# Patient Record
Sex: Male | Born: 1955 | Race: White | Hispanic: No | Marital: Married | Smoking: Never smoker
Health system: Southern US, Community
[De-identification: ages and names within clinical notes are randomized; demographics above are authoritative.]

## PROBLEM LIST (undated history)

## (undated) DIAGNOSIS — R202 Paresthesia of skin: Secondary | ICD-10-CM

## (undated) DIAGNOSIS — J309 Allergic rhinitis, unspecified: Secondary | ICD-10-CM

## (undated) DIAGNOSIS — S46311A Strain of muscle, fascia and tendon of triceps, right arm, initial encounter: Secondary | ICD-10-CM

## (undated) DIAGNOSIS — T7840XA Allergy, unspecified, initial encounter: Secondary | ICD-10-CM

## (undated) DIAGNOSIS — S060XAA Concussion with loss of consciousness status unknown, initial encounter: Secondary | ICD-10-CM

## (undated) DIAGNOSIS — D869 Sarcoidosis, unspecified: Secondary | ICD-10-CM

## (undated) DIAGNOSIS — K8020 Calculus of gallbladder without cholecystitis without obstruction: Secondary | ICD-10-CM

## (undated) DIAGNOSIS — J45909 Unspecified asthma, uncomplicated: Secondary | ICD-10-CM

## (undated) DIAGNOSIS — M21371 Foot drop, right foot: Secondary | ICD-10-CM

## (undated) DIAGNOSIS — H9319 Tinnitus, unspecified ear: Secondary | ICD-10-CM

## (undated) DIAGNOSIS — R200 Anesthesia of skin: Secondary | ICD-10-CM

## (undated) DIAGNOSIS — M1990 Unspecified osteoarthritis, unspecified site: Secondary | ICD-10-CM

## (undated) DIAGNOSIS — J189 Pneumonia, unspecified organism: Secondary | ICD-10-CM

## (undated) DIAGNOSIS — I2699 Other pulmonary embolism without acute cor pulmonale: Secondary | ICD-10-CM

## (undated) DIAGNOSIS — S060X9A Concussion with loss of consciousness of unspecified duration, initial encounter: Secondary | ICD-10-CM

## (undated) DIAGNOSIS — B958 Unspecified staphylococcus as the cause of diseases classified elsewhere: Secondary | ICD-10-CM

## (undated) DIAGNOSIS — R911 Solitary pulmonary nodule: Secondary | ICD-10-CM

## (undated) DIAGNOSIS — K219 Gastro-esophageal reflux disease without esophagitis: Secondary | ICD-10-CM

## (undated) DIAGNOSIS — R42 Dizziness and giddiness: Secondary | ICD-10-CM

## (undated) HISTORY — DX: Other pulmonary embolism without acute cor pulmonale: I26.99

## (undated) HISTORY — DX: Sarcoidosis, unspecified: D86.9

## (undated) HISTORY — PX: KNEE SURGERY: SHX244

## (undated) HISTORY — PX: ANKLE SURGERY: SHX546

## (undated) HISTORY — DX: Unspecified asthma, uncomplicated: J45.909

## (undated) HISTORY — PX: PICC LINE PLACE PERIPHERAL (ARMC HX): HXRAD1248

## (undated) HISTORY — PX: OTHER SURGICAL HISTORY: SHX169

## (undated) HISTORY — PX: EYE SURGERY: SHX253

## (undated) HISTORY — DX: Allergy, unspecified, initial encounter: T78.40XA

## (undated) HISTORY — PX: BACK SURGERY: SHX140

## (undated) HISTORY — DX: Allergic rhinitis, unspecified: J30.9

## (undated) HISTORY — DX: Solitary pulmonary nodule: R91.1

---

## 1999-03-02 ENCOUNTER — Ambulatory Visit (HOSPITAL_COMMUNITY): Admission: RE | Admit: 1999-03-02 | Discharge: 1999-03-02 | Payer: Self-pay | Admitting: Orthopedic Surgery

## 2004-08-07 ENCOUNTER — Observation Stay (HOSPITAL_COMMUNITY): Admission: EM | Admit: 2004-08-07 | Discharge: 2004-08-08 | Payer: Self-pay | Admitting: *Deleted

## 2005-07-27 ENCOUNTER — Encounter: Admission: RE | Admit: 2005-07-27 | Discharge: 2005-07-27 | Payer: Self-pay | Admitting: Neurosurgery

## 2005-08-09 ENCOUNTER — Ambulatory Visit (HOSPITAL_COMMUNITY): Admission: RE | Admit: 2005-08-09 | Discharge: 2005-08-10 | Payer: Self-pay | Admitting: Neurosurgery

## 2005-08-12 ENCOUNTER — Ambulatory Visit (HOSPITAL_COMMUNITY): Admission: RE | Admit: 2005-08-12 | Discharge: 2005-08-12 | Payer: Self-pay | Admitting: Neurosurgery

## 2005-08-18 ENCOUNTER — Ambulatory Visit (HOSPITAL_COMMUNITY): Admission: RE | Admit: 2005-08-18 | Discharge: 2005-08-19 | Payer: Self-pay | Admitting: Neurosurgery

## 2007-01-16 ENCOUNTER — Encounter: Admission: RE | Admit: 2007-01-16 | Discharge: 2007-01-16 | Payer: Self-pay | Admitting: Neurosurgery

## 2009-10-01 ENCOUNTER — Ambulatory Visit (HOSPITAL_COMMUNITY): Admission: RE | Admit: 2009-10-01 | Discharge: 2009-10-01 | Payer: Self-pay | Admitting: Neurosurgery

## 2010-12-11 ENCOUNTER — Emergency Department (HOSPITAL_COMMUNITY)
Admission: EM | Admit: 2010-12-11 | Discharge: 2010-12-11 | Payer: Self-pay | Source: Home / Self Care | Admitting: Emergency Medicine

## 2011-02-05 ENCOUNTER — Emergency Department (HOSPITAL_BASED_OUTPATIENT_CLINIC_OR_DEPARTMENT_OTHER)
Admission: EM | Admit: 2011-02-05 | Discharge: 2011-02-05 | Disposition: A | Discharge status: Home / Self Care | Payer: 59 | Attending: Emergency Medicine | Admitting: Emergency Medicine

## 2011-02-05 ENCOUNTER — Emergency Department (INDEPENDENT_AMBULATORY_CARE_PROVIDER_SITE_OTHER): Payer: 59

## 2011-02-05 DIAGNOSIS — W19XXXA Unspecified fall, initial encounter: Secondary | ICD-10-CM

## 2011-02-05 DIAGNOSIS — M25559 Pain in unspecified hip: Secondary | ICD-10-CM | POA: Insufficient documentation

## 2011-02-05 DIAGNOSIS — M79609 Pain in unspecified limb: Secondary | ICD-10-CM | POA: Insufficient documentation

## 2011-02-05 DIAGNOSIS — R109 Unspecified abdominal pain: Secondary | ICD-10-CM | POA: Insufficient documentation

## 2011-02-05 LAB — URINALYSIS, ROUTINE W REFLEX MICROSCOPIC
Bilirubin Urine: NEGATIVE
Hgb urine dipstick: NEGATIVE
Nitrite: NEGATIVE
Urine Glucose, Fasting: NEGATIVE mg/dL

## 2011-02-05 MED ORDER — IOHEXOL 300 MG/ML  SOLN
100.0000 mL | Freq: Once | INTRAMUSCULAR | Status: AC | PRN
  Administered 2011-02-05: 100 mL via INTRAVENOUS

## 2011-05-06 NOTE — Op Note (Signed)
NAME:  Jeffrey Adkins, Jeffrey Adkins                         ACCOUNT NO.:  [id number]   MEDICAL RECORD NO.:  [id number]                   PATIENT TYPE:  INP   LOCATION:  0102                                 FACILITY:  [facility]   PHYSICIAN:  Vania Rea. Supple, M.D.               DATE OF BIRTH:  [birth year]   DATE OF PROCEDURE:  08/07/2004  DATE OF DISCHARGE:                                 OPERATIVE REPORT   PREOPERATIVE DIAGNOSIS:  Displaced right ankle medial malleolus fracture.   POSTOPERATIVE DIAGNOSIS:  Displaced right ankle medial malleolus fracture.   PROCEDURE:  Open reduction and internal fixation of displaced right medial  malleolus fracture.   SURGEON OF RECORD:  Francena Hanly, M.D.   ASSISTANT:  French Ana Shuford, P.A.-C.   ANESTHESIA:  General endotracheal.   TOURNIQUET TIME:  39 minutes.   ESTIMATED BLOOD LOSS:  Minimal.   DRAINS:  None.   HISTORY:  [name] is a 55 year old gentleman, who was rock climbing [place] earlier today when he apparently lost his balance and fell close to  what he describes as 60 feet.  He came down landing on the right ankle with  immediate complaints of pain and inability to bear weight.  He had no  complaints of other bodily pains, although did have some superficial  abrasions.  He transported by private vehicle to [facility] where x-rays of  the right ankle were obtained and confirmed the displaced right ankle medial  malleolus fracture.  He is subsequently brought to the operating room at  this time for planned ORIF of the right ankle.   Preoperatively, we have Mr. Koble as well as his wife on treatment options  as well as risks versus benefits thereof.  Possible surgical complications  of bleeding, infection, neurovascular injury, DVT, PE, malunion, nonunion,  posttraumatic arthritis, and possible need for additional surgery are all  reviewed.  He understands and accepts and agrees with planned procedure.   PROCEDURE IN DETAIL:  After  undergoing routine preoperative evaluation, the  patient received prophylactic antibiotics.  Placed supine on the operative  table and underwent smooth induction of general endotracheal anesthesia.  Tourniquet applied to the right thigh, and right leg was then sterilely  prepped and draped in standard fashion.  Leg was exsanguinated with the  tourniquet inflated to 300 mmHg.  An anterior hockey-stick incision was  then made about the medial malleolus approximately 4 cm in length.  Skin  flaps were circumferentially mobilized, and the underlying superficial bands  were electrocoagulated.  Deep fascia was divided to allow an access to the  fracture site with large hematoma evacuated.  Periosteum was then divided  away from the edges of the fracture site and on inspection, the medial  malleolar fracture fragment was found to have been rotated 90 degrees.  It  was reduced and then the joint visualized and copiously irrigated.  No  obvious intraarticular  loose bodies or articular cartilage damaged.  Interpose soft tissue was then meticulously removed.  Direct reduction was  performed under direct visualization and held with a towel clip and then a  two-thread tip guidepins for the 3.5 cannulated screws were then passed.  Fluoroscopic imaging was then utilized and confirmed to anatomic alignment  of the fracture site.  The guidepins were then overdrilled, tapped, and two  44 mm partially-threaded cannulated 3.5 lag screws were passed.  The  anterior one had a washer; the posterior one did not.  Excellent bony  purchase was achieved with both screws, and good compression at the fracture  site was achieved.  Subsequent fluoroscopic images confirmed anatomic  reduction at the fracture site and good position of the hardware.  The wound  was then copiously irrigated.  It was closed in layers with 3-0 Vicryl for  the subcutaneous layer, intracuticular 3-0 Monocryl for the skin.  Steri-  Strips were  applied.  A field block of 0.5% plain Marcaine was then  established.  Bulky dry dressings were applied followed by well-padded short  leg plaster splint with the ankle in neutral position.  The tourniquet was  then let down, and the patient was extubated and taken to the recovery room  in stable condition.                                               Vania Rea. Supple, M.D.    KMS/MEDQ  D:  08/07/2004  T:  08/08/2004  Job:  [id number]

## 2011-05-06 NOTE — Op Note (Signed)
NAME:  Jeffrey Adkins, Jeffrey Adkins NO.:  [id number]   MEDICAL RECORD NO.:  [id number]          PATIENT TYPE:  OIB   LOCATION:  3032                         FACILITY:  [facility]   PHYSICIAN:  Donalee Citrin, M.D.        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   OPERATION/PROCEDURE:  1.  Redo lumbar laminectomy.  2.  Reexploration of previous surgical incision at L4-5.  3.  Redo diskectomy at L4-5 with redo foraminotomy at L5 nerve root under      microscopic dissection.   SURGEON:  Donalee Citrin, M.D.   ASSISTANT:  Kathaleen Maser. Pool, M.D.   ANESTHESIA:  General endotracheal anesthesia.   FINDINGS:  Large recurrent ruptured disk centrally under the L5 nerve root  as well as laterally in the canal compressing the thecal sac and the  proximal L5 nerve root.   HISTORY OF PRESENT ILLNESS:  The patient is a very pleasant 55 year old  gentleman who underwent laminectomy and microdiskectomy a week ago. Had  about two days where he had symptomatic improvement of the leg and the leg  started recurring refractory to steroid treatment.  Repeat MRI scan showed  persistent thecal sac defect and central probable disk herniation at the  operative site as the patient had failed steroid treatment for possible  radiculitis.  The patient was taken back for reexploration.   DESCRIPTION OF PROCEDURE:  The patient was brought to the OR where he  received general anesthesia, placed supine on the Wilson frame.  His old  incision was opened up and extended slightly inferiorly.  At this time the  muscle fascia was taken down bilaterally and the very tip of the spinous  process was removed to gain more medial access and medial exposure.  The  Gelfoam was removed from the operative site and laminotomy defect, and the  lateral facet complex was drilled down and a facetectomy was extended  slightly laterally.  Then a #4 Nicholos Johns was used to get underneath the  thecal sac and immediately what was visualized was a large lateral disk  herniation that was new.  This was eased away with a blunt nerve hook and  pituitary rongeurs.  Then underneath the L5 root, coronary dilator was  palpated and a very large central fragment was removed and at this point,  the L5 nerve root immediately became freed up and more easily mobilized  around the pedicle.  The disk space was entered and additional fragments  were teased away.  Extensive work was carried out laterally to get further  lateral fragments and free up the lateral complex underneath the facet  joint.  The pedicle was again appreciated and the facetectomy was carried  slightly lateral and the foraminotomy was extended slightly distally to get  more flush with the pedicle and get further the L5 nerve root exposed.  This  allowed more free mobilization of the  L5 nerve root, easily palpated with  coronary dilator and angle hockey stick and noted to have no resistance at  all both centrally and distally.  Then after the lateral facetectomy had  been completed, there was no further stenosis of the disk lateral in the  canal.  Then the wound was copiously irrigated and hemostasis was  maintained.  The disk space again was markedly collapsed and degenerated.  Gelfoam was laid on top of the dura.  There was no spinal fluid appreciated.  The Duragen patch previously laid was kept in place. This was not removed  and the L5 lamina was intact. This was not needed to be  extended inferiorly.  Then muscle fascia was reapproximated with 0  interrupted Vicryl.  The subcutaneous tissue was closed with 2-0 interrupted  Vicryl and skin was closed with running 4-0 subcuticular.  The patient went  to the recovery room in stable condition.  At the end of the case,   __________.           ______________________________  Donalee Citrin, M.D.     GC/MEDQ  D:  08/18/2005  T:  08/19/2005  Job:  [id number]

## 2011-05-06 NOTE — Op Note (Signed)
NAME:  Jeffrey Adkins, Jeffrey Adkins NO.:  [id number]   MEDICAL RECORD NO.:  [id number]          PATIENT TYPE:  OIB   LOCATION:  2874                         FACILITY:  [facility]   PHYSICIAN:  Donalee Citrin, M.D.        DATE OF BIRTH:  [birth year]   DATE OF PROCEDURE:  08/09/2005  DATE OF DISCHARGE:                                 OPERATIVE REPORT   PREOPERATIVE DIAGNOSIS:  Left L5 radiculopathy from large ruptured disk at  L4-5 left with caudal migration behind the L5 vertebral body and compression  of the L5 nerve against the pedicle.   POSTOPERATIVE DIAGNOSIS:  Left L5 radiculopathy from large ruptured disk at  L4-5 left with caudal migration behind the L5 vertebral body and compression  of the L5 nerve against the pedicle.   OPERATION PERFORMED:  Lumbar laminectomy and microdiskectomy, L4-5 on the  left.  Taking into account the patient has a transitional segment and this  was the second true disk space above the transitional segment which we will  label T1 with L5-T1 being the bottom disk space.  Microscopic dissection of  the left L5 nerve root and microscopic diskectomy.   SURGEON:  Donalee Citrin, M.D.   ASSISTANT:  Reinaldo Meeker, M.D.   ANESTHESIA:  General endotracheal.   INDICATIONS FOR PROCEDURE:  The patient is a very pleasant 55 year old  gentleman who has had longstanding back and predominantly left leg pain  radiating down to the top of his foot and his big toe with weakness on  dorsiflexion, presented today.  The patient's preoperative imaging showed a  large ruptured disk with caudal migration behind the L5 vertebral body with  compression of the L5 nerve against the L5 pedicle.  The patient was  recommended laminectomy and microdiskectomy.  The risks and benefits were  explained to the patient and he understands and agreed to proceed forward.   DESCRIPTION OF PROCEDURE:  The patient was brought to the operating room  where he was induced under general  anesthesia, positioned prone on the  Wilson frame.  Back was scrubbed, prepped and draped in the usual sterile  fashion.  His preoperative imaging localized the L4-5 disk space at the  superior aspect of his old scar from his previous L5-T1 laminectomy.  After  infiltration of 10 mL of lidocaine with epinephrine, a midline incision was  made and Bovie electrocautery was used to take down the subcutaneous  tissues.  Subperiosteal dissection was carried out lamina of L4 and L5.  Intraoperative x-ray confirmed localization of the L4-5 disk space.  Then  the medial aspect of the facet complex, inferior aspect of the lamina of L4,  medial spinous process of L4 and superior aspect of lamina of L5 was drilled  down with a high speed drill. Then using a 2 and 3 mm Kerrison punch, the  inferior aspect of the lamina of L5 was removed.  The lamina was noted to be  markedly thickened and had to be drilled down more to create a channel for  the Kerrison to fit under.  Then after the  superior aspect of the ligamentum  flavum was identified, this was teased away with a blunt nerve hook,  exposing the thecal sac.  At this point the operating microscope was draped  and brought into the field and under microscopic illumination, the remainder  of the ligament was dissected off of the dura and the lateral aspect of the  canal was identified using a combination of a blunt nerve hook and a 4  Penfield.  The ligamentum flavum was dissected free of the thecal sac  exposing the lateral gutter and the proximal L5 nerve root.  The L5 neural  foramen was unroofed and the superior aspect of the lamina of L5 was removed  and the lateral gutter was underbitten to expose the lateral aspect of the  thecal sac.  Then using a 4 Penfield, the L5 nerve root was dissected off.  A large bulbous disk fragment was still contained within the ligament,  however, and this was used to reflect the L5 nerve root medially.  Then a   D'Errico was used to place in the canal reflecting the L5 nerve root  medially.  Annulotomy was done with an 11 blade scalpel.  Pituitary rongeurs  were used to radically clean out the disk space and several large fragments  were removed from just inferior to the disk space at the level of the  pedicle.  The ligament underneath the L5 nerve was then markedly  hypertrophied and several fragments of disk had to be freed up from the  undersurface of this ligament and this took a little bit of extra time;  however, at the end of the diskectomy there was no further stenosis of the  L5 ligament.  Because of the thickness of the ligament and the ability to  kind of see distally out the neural foramen it was felt to extend the L5  laminectomy slightly inferior to get better access and better view of the L5  nerve and in this process there was a small thickness dural tear appreciated  superior aspect of the lamina. This was not leaking CSF, so it was packed  away with a piece of Gelfoam and cottonoids at the time being.  Then the  remainder of the undersurface of the 5 nerve root was palpated and explored  with a coronary dilator and angled hockey stick and noted to have no further  stenosis distally out the foramen.  The lateral disk space was then cleaned  out with pituitary rongeurs and the medial disk space was appreciated.  Downgoing Epstein curettes were then used to clean out the central  compartment and using a coronary dilator and angled hockey stick, no further  stenosis was appreciated superior, inferior, medial, lateral at the level of  the disk space and the level of the pedicle.  The L5 nerve root was noted to  be widely compressed and unroofed.  At this point the wound was copiously  irrigated and meticulous hemostasis was maintained.  A small piece of 1 x 1  Duragen was overlaid along the top of the split thickness dural tear and Tisseel was overlaid on top of this.  Then Gelfoam and  Tisseel again.  Then  hemostasis was maintained in the muscle and the muscle and subcutaneous  tissues were closed in layers with interrupted Vicryl.  Then a running 4-0  subcuticular Vicryl was placed.  Dermabond, benzoin and Steri-Strips applied  and the patient was sent to recovery in stable condition.  At the end  of the  case sponge, needle and instrument counts were correct.           ______________________________  Donalee Citrin, M.D.     GC/MEDQ  D:  08/09/2005  T:  08/10/2005  Job:  [id number]

## 2013-01-28 ENCOUNTER — Encounter (HOSPITAL_COMMUNITY): Payer: Self-pay | Admitting: Nurse Practitioner

## 2013-01-28 ENCOUNTER — Emergency Department (HOSPITAL_COMMUNITY)
Admission: EM | Admit: 2013-01-28 | Discharge: 2013-01-28 | Discharge status: Against Medical Advice | Payer: 59 | Attending: Emergency Medicine | Admitting: Emergency Medicine

## 2013-01-28 ENCOUNTER — Emergency Department (HOSPITAL_COMMUNITY): Payer: 59

## 2013-01-28 DIAGNOSIS — R071 Chest pain on breathing: Secondary | ICD-10-CM | POA: Insufficient documentation

## 2013-01-28 DIAGNOSIS — R0602 Shortness of breath: Secondary | ICD-10-CM | POA: Insufficient documentation

## 2013-01-28 LAB — POCT I-STAT TROPONIN I: Troponin i, poc: 0.01 ng/mL (ref 0.00–0.08)

## 2013-01-28 LAB — CBC
HCT: 41.3 % (ref 39.0–52.0)
Hemoglobin: 15 g/dL (ref 13.0–17.0)
MCHC: 36.3 g/dL — ABNORMAL HIGH (ref 30.0–36.0)
Platelets: 233 10*3/uL (ref 150–400)
WBC: 7.7 10*3/uL (ref 4.0–10.5)

## 2013-01-28 LAB — BASIC METABOLIC PANEL
CO2: 28 mEq/L (ref 19–32)
Chloride: 103 mEq/L (ref 96–112)
Sodium: 142 mEq/L (ref 135–145)

## 2013-01-28 LAB — PRO B NATRIURETIC PEPTIDE: Pro B Natriuretic peptide (BNP): 62.2 pg/mL (ref 0–125)

## 2013-01-28 NOTE — ED Notes (Signed)
Pt reports he was sitting at work yesterday and felt sudden pain under L rib that since has moved into bilateral lower chest and back. Hurts to breathe and feels mild SOB. Pt reports recent [place] from [place]. A&Ox4, resp e/u. Denies cardiac history

## 2013-01-28 NOTE — ED Notes (Signed)
Pt did not answer.

## 2013-02-21 ENCOUNTER — Emergency Department (HOSPITAL_BASED_OUTPATIENT_CLINIC_OR_DEPARTMENT_OTHER): Payer: 59

## 2013-02-21 ENCOUNTER — Encounter (HOSPITAL_BASED_OUTPATIENT_CLINIC_OR_DEPARTMENT_OTHER): Payer: Self-pay | Admitting: *Deleted

## 2013-02-21 ENCOUNTER — Other Ambulatory Visit: Payer: Self-pay

## 2013-02-21 ENCOUNTER — Inpatient Hospital Stay (HOSPITAL_BASED_OUTPATIENT_CLINIC_OR_DEPARTMENT_OTHER)
Admission: EM | Admit: 2013-02-21 | Discharge: 2013-02-21 | DRG: 176 | Discharge status: Against Medical Advice | Payer: 59 | Attending: Internal Medicine | Admitting: Internal Medicine

## 2013-02-21 DIAGNOSIS — Z91012 Allergy to eggs: Secondary | ICD-10-CM

## 2013-02-21 DIAGNOSIS — R911 Solitary pulmonary nodule: Secondary | ICD-10-CM

## 2013-02-21 DIAGNOSIS — K5900 Constipation, unspecified: Secondary | ICD-10-CM | POA: Diagnosis present

## 2013-02-21 DIAGNOSIS — R599 Enlarged lymph nodes, unspecified: Secondary | ICD-10-CM

## 2013-02-21 DIAGNOSIS — Z91010 Allergy to peanuts: Secondary | ICD-10-CM

## 2013-02-21 DIAGNOSIS — K8020 Calculus of gallbladder without cholecystitis without obstruction: Secondary | ICD-10-CM | POA: Diagnosis present

## 2013-02-21 DIAGNOSIS — Z880 Allergy status to penicillin: Secondary | ICD-10-CM

## 2013-02-21 DIAGNOSIS — I2699 Other pulmonary embolism without acute cor pulmonale: Principal | ICD-10-CM

## 2013-02-21 DIAGNOSIS — Z885 Allergy status to narcotic agent status: Secondary | ICD-10-CM

## 2013-02-21 DIAGNOSIS — Z91018 Allergy to other foods: Secondary | ICD-10-CM

## 2013-02-21 DIAGNOSIS — D869 Sarcoidosis, unspecified: Secondary | ICD-10-CM

## 2013-02-21 DIAGNOSIS — R590 Localized enlarged lymph nodes: Secondary | ICD-10-CM

## 2013-02-21 DIAGNOSIS — J90 Pleural effusion, not elsewhere classified: Secondary | ICD-10-CM

## 2013-02-21 DIAGNOSIS — R071 Chest pain on breathing: Secondary | ICD-10-CM | POA: Diagnosis present

## 2013-02-21 HISTORY — DX: Solitary pulmonary nodule: R91.1

## 2013-02-21 LAB — COMPREHENSIVE METABOLIC PANEL
ALT: 28 U/L (ref 0–53)
ALT: 26 U/L (ref 0–53)
AST: 24 U/L (ref 0–37)
AST: 21 U/L (ref 0–37)
CO2: 26 mEq/L (ref 19–32)
CO2: 27 mEq/L (ref 19–32)
Calcium: 9.8 mg/dL (ref 8.4–10.5)
Calcium: 9.8 mg/dL (ref 8.4–10.5)
Creatinine, Ser: 1.24 mg/dL (ref 0.50–1.35)
GFR calc Af Amer: 75 mL/min — ABNORMAL LOW (ref >90)
GFR calc non Af Amer: 61 mL/min — ABNORMAL LOW (ref >90)
GFR calc non Af Amer: 64 mL/min — ABNORMAL LOW (ref >90)
Sodium: 137 mEq/L (ref 135–145)
Sodium: 140 mEq/L (ref 135–145)
Total Protein: 7.4 g/dL (ref 6.0–8.3)
Total Protein: 7 g/dL (ref 6.0–8.3)

## 2013-02-21 LAB — URINALYSIS, ROUTINE W REFLEX MICROSCOPIC
Leukocytes, UA: NEGATIVE
Nitrite: NEGATIVE
Protein, ur: NEGATIVE mg/dL
Urobilinogen, UA: 1 mg/dL (ref 0.0–1.0)

## 2013-02-21 LAB — CBC WITH DIFFERENTIAL/PLATELET
Basophils Absolute: 0 10*3/uL (ref 0.0–0.1)
Eosinophils Relative: 2 % (ref 0–5)
HCT: 40.4 % (ref 39.0–52.0)
Hemoglobin: 14.7 g/dL (ref 13.0–17.0)
Lymphocytes Relative: 15 % (ref 12–46)
Lymphs Abs: 2 10*3/uL (ref 0.7–4.0)
MCH: 31 pg (ref 26.0–34.0)
MCHC: 36.4 g/dL — ABNORMAL HIGH (ref 30.0–36.0)
MCV: 86 fL (ref 78.0–100.0)
Monocytes Absolute: 1.2 10*3/uL — ABNORMAL HIGH (ref 0.1–1.0)
Monocytes Relative: 13 % — ABNORMAL HIGH (ref 3–12)
Neutro Abs: 5.5 10*3/uL (ref 1.7–7.7)
Neutrophils Relative %: 62 % (ref 43–77)
Platelets: 180 10*3/uL (ref 150–400)
RBC: 4.74 MIL/uL (ref 4.22–5.81)
RDW: 13 % (ref 11.5–15.5)
WBC: 10.7 10*3/uL — ABNORMAL HIGH (ref 4.0–10.5)

## 2013-02-21 LAB — TROPONIN I: Troponin I: <0.3 ng/mL (ref <0.30)

## 2013-02-21 LAB — PROTIME-INR: INR: 1.25 (ref 0.00–1.49)

## 2013-02-21 MED ORDER — RIVAROXABAN 15 MG PO TABS
15.0000 mg | ORAL_TABLET | Freq: Two times a day (BID) | ORAL | Status: DC
  Administered 2013-02-21: 15 mg via ORAL
  Filled 2013-02-21 (×3): qty 1

## 2013-02-21 MED ORDER — HYDROMORPHONE HCL PF 1 MG/ML IJ SOLN: 1.0000 mg | INTRAMUSCULAR | Status: DC | PRN

## 2013-02-21 MED ORDER — HEPARIN (PORCINE) IN NACL 100-0.45 UNIT/ML-% IJ SOLN
12.0000 [IU]/kg/h | Freq: Once | INTRAMUSCULAR | Status: AC
  Administered 2013-02-21: 12 [IU]/kg/h via INTRAVENOUS
  Filled 2013-02-21: qty 250

## 2013-02-21 MED ORDER — SODIUM CHLORIDE 0.9 % IJ SOLN: 3.0000 mL | Freq: Two times a day (BID) | INTRAMUSCULAR | Status: DC

## 2013-02-21 MED ORDER — SODIUM CHLORIDE 0.9 % IV SOLN: INTRAVENOUS | Status: DC

## 2013-02-21 MED ORDER — ONDANSETRON HCL 4 MG PO TABS: 4.0000 mg | ORAL_TABLET | Freq: Four times a day (QID) | ORAL | Status: DC | PRN

## 2013-02-21 MED ORDER — TUBERCULIN PPD 5 UNIT/0.1ML ID SOLN
5.0000 [IU] | Freq: Once | INTRADERMAL | Status: DC
  Administered 2013-02-21: 5 [IU] via INTRADERMAL
  Filled 2013-02-21: qty 0.1

## 2013-02-21 MED ORDER — IOHEXOL 350 MG/ML SOLN
100.0000 mL | Freq: Once | INTRAVENOUS | Status: AC | PRN
  Administered 2013-02-21: 100 mL via INTRAVENOUS

## 2013-02-21 MED ORDER — RIVAROXABAN 15 MG PO TABS: 15.0000 mg | ORAL_TABLET | Freq: Two times a day (BID) | ORAL | Status: DC

## 2013-02-21 MED ORDER — HEPARIN BOLUS VIA INFUSION
4000.0000 [IU] | Freq: Once | INTRAVENOUS | Status: AC
  Administered 2013-02-21: 4000 [IU] via INTRAVENOUS
  Filled 2013-02-21: qty 4000

## 2013-02-21 MED ORDER — ACETAMINOPHEN 650 MG RE SUPP: 650.0000 mg | Freq: Four times a day (QID) | RECTAL | Status: DC | PRN

## 2013-02-21 MED ORDER — ACETAMINOPHEN 325 MG PO TABS: 650.0000 mg | ORAL_TABLET | Freq: Four times a day (QID) | ORAL | Status: DC | PRN

## 2013-02-21 MED ORDER — KETOROLAC TROMETHAMINE 30 MG/ML IJ SOLN
30.0000 mg | Freq: Once | INTRAMUSCULAR | Status: AC
  Administered 2013-02-21: 30 mg via INTRAVENOUS
  Filled 2013-02-21: qty 1

## 2013-02-21 MED ORDER — ONDANSETRON HCL 4 MG/2ML IJ SOLN: 4.0000 mg | Freq: Four times a day (QID) | INTRAMUSCULAR | Status: DC | PRN

## 2013-02-21 MED ORDER — HEPARIN (PORCINE) IN NACL 100-0.45 UNIT/ML-% IJ SOLN
1450.0000 [IU]/h | INTRAMUSCULAR | Status: DC
  Administered 2013-02-21: 1450 [IU]/h via INTRAVENOUS
  Filled 2013-02-21: qty 250

## 2013-02-21 NOTE — Progress Notes (Signed)
ANTICOAGULATION CONSULT NOTE - Initial Consult  Pharmacy Consult for heparin Indication: pulmonary embolus  Allergies  Allergen Reactions  . Hydrocodone Hives  . Penicillins Hives    Patient Measurements: Height: 5\' 11"  (180.3 cm) Weight: 180 lb (81.647 kg) IBW/kg (Calculated) : 75.3 Heparin Dosing Weight: 82 kg  Vital Signs: Temp: 98.2 F (36.8 C) (03/06 0153) Temp src: Oral (03/06 0153) BP: 122/69 mmHg (03/06 0347) Pulse Rate: 87 (03/06 0153)  Labs:  Recent Labs  02/21/13 0203  HGB 14.8  HCT 41.8  PLT 180  CREATININE 1.30  TROPONINI <0.30    Estimated Creatinine Clearance: 69.2 ml/min (by C-G formula based on Cr of 1.3).   Medical History: Past Medical History  Diagnosis Date  . Medical history non-contributory     Medications:  Scheduled:  . [COMPLETED] heparin  12 Units/kg/hr Intravenous Once  . [COMPLETED] ketorolac  30 mg Intravenous Once  . sodium chloride  3 mL Intravenous Q12H    Assessment: 57 yo male presented to [facility] with chest pain and flank pain. Chest CT showed pulmonary embolism. Patient was started on IV heparin at 1000 units/hr (no bolus) and transferred to [facility] Pharmacy to manage IV heparin.   Goal of Therapy:  Heparin level 0.3-0.7 units/ml Monitor platelets by anticoagulation protocol: Yes   Plan:  1. Heparin IV bolus 4000 units x 1, then increase  IV infusion to 1450 units/hr.  2. Heparin level in 6 hours. 3. Daily CBC, heparin level   Emeline Gins 02/21/2013,6:29 AM

## 2013-02-21 NOTE — ED Provider Notes (Signed)
History     CSN: [id number]  Arrival date & time 02/21/13  0148   First MD Initiated Contact with Patient 02/21/13 [phone]      Chief Complaint  Patient presents with  . Flank Pain  . Chest Pain    (Consider location/radiation/quality/duration/timing/severity/associated sxs/prior treatment) Patient is a 57 y.o. male presenting with flank pain and chest pain. The history is provided by the patient.  Flank Pain This is a new problem. The problem occurs daily. The problem has been gradually worsening. Associated symptoms include chest pain. Pertinent negatives include no abdominal pain, no headaches and no shortness of breath. Associated symptoms comments: Right axillary area x 1 month.  Constant x 48 hours.  Worse when he lays on that side.  . Nothing aggravates the symptoms. Nothing relieves the symptoms. He has tried nothing for the symptoms. The treatment provided no relief.  Chest Pain Pain location:  R lateral chest Pain quality: sharp   Pain radiates to:  Does not radiate Pain severity:  Moderate Onset quality:  Gradual Duration:  1 month Timing:  Intermittent Progression:  Worsening Chronicity:  New Context comment:  Laying on that side.  Startsin right flank and comes up to that area Relieved by:  Nothing Worsened by:  Nothing tried Ineffective treatments:  None tried Associated symptoms: no abdominal pain, no anorexia, no cough, no diaphoresis, no fever, no headache, no nausea, no numbness, no palpitations, no shortness of breath, no syncope, not vomiting and no weakness   Risk factors: male sex   Risk factors: no coronary artery disease, no hypertension, no smoking and no surgery   Symptoms have been going on for about a month but have become more severe and constant in the past 48 hours.  They are not affected by meals.  He had a trip to [place] before the the symptoms started.  But denies cough and fever.  Went to [facility] when the symptoms started but left after triage.  What  makes them worse is laying on that side but he denies that the area hurts to palpation.  He has not seen his family doctor in that time.  No SOb no DOE.  No n/v/d.  Has also been constipated and having urinary frequency but no dysuria, no hematuria.  No abdominal pain no pelvic pain.  Pain starts at the area of the right kidney and radiates up into the right posterior chest.  History reviewed. No pertinent past medical history.  History reviewed. No pertinent past surgical history.  No family history on file.  History  Substance Use Topics  . Smoking status: Never Smoker   . Smokeless tobacco: Not on file  . Alcohol Use: No      Review of Systems  Constitutional: Negative for fever and diaphoresis.  HENT: Negative for neck pain.   Respiratory: Negative for cough and shortness of breath.   Cardiovascular: Positive for chest pain. Negative for palpitations and syncope.  Gastrointestinal: Positive for constipation. Negative for nausea, vomiting, abdominal pain and anorexia.  Genitourinary: Positive for frequency and flank pain.  Neurological: Negative for weakness, numbness and headaches.  All other systems reviewed and are negative.    Allergies  Hydrocodone and Penicillins  Home Medications  No current outpatient prescriptions on file.  BP 119/75  Pulse 87  Temp(Src) 98.2 F (36.8 C) (Oral)  Resp 16  Ht 5\' 11"  (1.803 m)  Wt 180 lb (81.647 kg)  BMI 25.12 kg/m2  SpO2 98%  Physical Exam  Constitutional:  He is oriented to person, place, and time. He appears well-developed and well-nourished. No distress.  HENT:  Head: Normocephalic and atraumatic.  Mouth/Throat: Oropharynx is clear and moist.  Eyes: Conjunctivae are normal. Pupils are equal, round, and reactive to light.  Neck: Normal range of motion. Neck supple. No tracheal deviation present.  Cardiovascular: Normal rate and regular rhythm.   Pulmonary/Chest: Effort normal and breath sounds normal. He has no wheezes.  He has no rales.  Abdominal: Soft. Bowel sounds are normal. There is no tenderness. There is no rebound and no guarding.  Musculoskeletal: Normal range of motion. He exhibits no edema.  Neurological: He is alert and oriented to person, place, and time. He has normal reflexes.  Skin: Skin is warm and dry.  Psychiatric: He has a normal mood and affect.    ED Course  Procedures (including critical care time)  Labs Reviewed  URINALYSIS, ROUTINE W REFLEX MICROSCOPIC  CBC WITH DIFFERENTIAL  COMPREHENSIVE METABOLIC PANEL  TROPONIN I  D-DIMER, QUANTITATIVE   No results found.   No diagnosis found.    MDM   Date: 02/21/2013  Rate: 73  Rhythm: normal sinus rhythm  QRS Axis: normal  Intervals: normal  ST/T Wave abnormalities: nonspecific T wave changes  Conduction Disutrbances:none  Narrative Interpretation:   Old EKG Reviewed: changes noted  Will admit for PE.  Patient and wife also informed of pulmonary nodule and mediastinal lymphadenopathy suspicious and will require biopsy.  Verbalize understanding and agree to follow up       April K Palumbo-Rasch, MD 02/21/13 [phone]

## 2013-02-21 NOTE — Consult Note (Signed)
PULMONARY  / CRITICAL CARE MEDICINE  Name: Jeffrey Adkins MRN: [id number] DOB: [birth year]    ADMISSION DATE:  02/21/2013 CONSULTATION DATE:  3/6  REFERRING MD : gates  CHIEF COMPLAINT:  Pulmonary nodules in setting of PE  BRIEF PATIENT DESCRIPTION:  57 year old male admitted 3/6 for PE (felt possibly provoked by travel to [place] ~ 1 month prior, which is about when his symptoms began). On CT of chest he was also found to have Pulmonary nodules and LN, which was the reason for our consult  SIGNIFICANT EVENTS / STUDIES:  CT chest 3/6: Small pulmonary emboli noted within pulmonary arteries to both lower lung lobes, more prominent on the right. Small pulmonary infarct at the right lower lobe, with associated small right pleural effusion. 3. 6 mm mildly lobulated nodule at the right upper lobe. Enlarged azygoesophageal recess, subcarinal and right hilar  nodes, of uncertain significance. The azygoesophageal recess node would be amenable to biopsy CT abd 3/6: Likely cholelithiasis; gallbladder otherwise unremarkable in appearance. 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 LE dopplers 3/6>>>  CULTURES:   ANTIBIOTICS:   HISTORY OF PRESENT ILLNESS:   57 y.o. male with no significant past medical history admitted on 3/6 w/ CC: right-sided pleuritic type of chest pain for last one month. Patient's symptoms started a month ago after he had travelled to [place]. Patient stated that he had come to the ER at that time and recheck his cardiac enzymes and since it was negative he was sent home. The pain has been persistent but not worsening over the last 2 days prior to admit with fatigue and shortness of breath. This time in the ER a CT angiogram was obtained which showed bilateral pulmonary embolism and small R pleural effusion. Incidentally noted is a 6 mm RUL pulmonary nodules with lymphadenopathy. Patient otherwise denies any nausea vomiting abdominal pain diarrhea  loss of weight fever chills. PCCM was asked to see in regards to the finding of pulmonary nodules.   Review of Systems: As presented in the history of presenting illness nothing else significant.   PAST MEDICAL HISTORY :  Past Medical History  Diagnosis Date  . Medical history non-contributory    Past Surgical History  Procedure Laterality Date  . Back surgery    . Knee surgery    . Ankle surgery     Prior to Admission medications   Medication Sig Start Date End Date Taking? Authorizing Jarrell Armond  OVER THE COUNTER MEDICATION Take 1 packet by mouth daily. Multi-vitamin with fish oil packet   Yes Historical Shae Augello, MD   Allergies  Allergen Reactions  . Peanut-Containing Drug Products Anaphylaxis    All nuts  . Hydrocodone Hives  . Mustard (Allyl Isothiocyanate)     other  . Penicillins Hives  . Eggs Or Egg-Derived Products Rash    FAMILY HISTORY:  Family History  Problem Relation Age of Onset  . Hypertension Mother    SOCIAL HISTORY:  reports that he has never smoked. He does not have any smokeless tobacco history on file. He reports that he does not drink alcohol or use illicit drugs.  REVIEW OF SYSTEMS:   Constitutional: Negative for fever, chills, weight loss, malaise/fatigue and diaphoresis.  HENT: Negative for hearing loss, ear pain, nosebleeds, congestion, sore throat, neck pain, tinnitus and ear discharge.   Eyes: Negative for blurred vision, double vision, photophobia, pain, discharge and redness.  Respiratory: Negative for cough, hemoptysis, sputum production, shortness of breath, wheezing  and stridor.  Pleuritic CP Cardiovascular: Negative for chest pain, palpitations, orthopnea, claudication, leg swelling and PND.  Gastrointestinal: Negative for heartburn, nausea, vomiting, abdominal pain, diarrhea, constipation, blood in stool and melena.  Genitourinary: Negative for dysuria, urgency, frequency, hematuria and flank pain.  Musculoskeletal: Negative for  myalgias, back pain, joint pain and falls.  Skin: Negative for itching and rash.  Neurological: Negative for dizziness, tingling, tremors, sensory change, speech change, focal weakness, seizures, loss of consciousness, weakness and headaches.  Endo/Heme/Allergies: Negative for environmental allergies and polydipsia. Does not bruise/bleed easily.   VITAL SIGNS: Temp:  [98.2 F (36.8 C)] 98.2 F (36.8 C) (03/06 0153) Pulse Rate:  [87] 87 (03/06 0153) Resp:  [16] 16 (03/06 0347) BP: (119-122)/(69-75) 122/69 mmHg (03/06 0347) SpO2:  [98 %-99 %] 99 % (03/06 0347) Weight:  [81.647 kg (180 lb)] 81.647 kg (180 lb) (03/06 0153)  PHYSICAL EXAMINATION: General:  Well nourished, appears stated age, no distress.  Neuro:  Awake, alert, no focal def  HEENT:  [place], no JVD  Cardiovascular:  RRR  Lungs:  Clear. Decreased in bases  Abdomen:  Soft, non-tender  Musculoskeletal:  Intact Skin:  Intact    Recent Labs Lab 02/21/13 0203 02/21/13 0640  NA 137 140  K 4.3 4.3  CL 101 101  CO2 26 27  BUN 16 14  CREATININE 1.30 1.24  GLUCOSE 112* 96    Recent Labs Lab 02/21/13 0203 02/21/13 0640  HGB 14.8 14.7  HCT 41.8 40.4  WBC 10.7* 9.0  PLT 180 166   CT chest: Bilateral PE, RLL infarct. Small RUL lobulated nodule. Several subcarinal LNS.    ASSESSMENT / PLAN: 1) Small bilateral PE. With mild RLL atx and small R effusion  - Risk factor seems to be well - defined > prolonged recent travel  - R pleuritic pain well explained by PE  - first occurrence @ age 60 with defined RF doesn't warrant search for hypercoagulopathy REC:  Cont full anticoagulation for total of 3-6 months F/u LE dopplers If venous dopplers negative, he can be safely discharged @ any time  2) Indeterminate RUL lung nodule. As a nonsmoker, low risk that this is malignant.  REC: - Per guidelines, a repeat CT chest in 12 months is indicated. - It would be reasonable (though not specifically part of guidelines) to  monitor plain CXRs q 3 months until then as a growing nodule will eventually show on plain film even if not seen initially  3) nonspecific R hilar LAN - likely reactive LNs. With recent travel and RUL nodule, PPD warranted REC: - PPD ordered - Please "read" in 48 - 72 hrs   Seen with Anders Simmonds, ACNP   Billy Fischer, MD ; [facility] service Mobile [phone].  After 5:30 PM or weekends, call [phone]  02/21/2013, 10:52 AM

## 2013-02-21 NOTE — Progress Notes (Signed)
Utilization Review Completed.Dowell, Deborah T3/05/2013  

## 2013-02-21 NOTE — Care Management Note (Signed)
    Page 1 of 1   02/21/2013     4:10:03 PM   CARE MANAGEMENT NOTE 02/21/2013  Patient:  Jeffrey Adkins, Jeffrey Adkins   Account Number:  [id number]  Date Initiated:  02/21/2013  Documentation initiated by:  Laray Rivkin  Subjective/Objective Assessment:   PT ADM ON 02/21/13 WITH PULMONARY EMBOLUS.  PTA, PT INDEPENDENT OF ADLS.     Action/Plan:   PT TO DC HOME ON XARELTO.  PT GIVEN XARELTO CAREPATH CARD TO REDUCE PHARMACY COPAY TO $10/MONTH.  PT APPRECIATIVE OF HELP.   Anticipated DC Date:  02/21/2013   Anticipated DC Plan:  HOME/SELF CARE      DC Planning Services  CM consult      Choice offered to / List presented to:             Status of service:  Completed, signed off Medicare Important Message given?   (If response is "NO", the following Medicare IM given date fields will be blank) Date Medicare IM given:   Date Additional Medicare IM given:    Discharge Disposition:    Per UR Regulation:  Reviewed for med. necessity/level of care/duration of stay  If discussed at Long Length of Stay Meetings, dates discussed:    Comments:

## 2013-02-21 NOTE — Discharge Summary (Signed)
Physician Discharge Summary  NAME:Jeffrey Adkins  ZOX:[id number]  DOB: Mar 07, 1956   Admit date: 02/21/2013 Discharge date: 02/21/2013  Discharge Diagnoses:  Principal Problem:   Pulmonary embolism - bilateral lower lobe with right pulmonary infarct and pleural effusion likely associated to pulmonary infarct.  Will treat with 3 months of anticoagulation therapy and patient should use aspirin therapy when traveling for prolonged periods where he is immobile.  Consider factor V Leiden screen  Active Problems:   Pulmonary nodule - mid right upper lobe - we'll follow with serial chest x-rays to 3 months and CT scan in one year   Mediastinal lymphadenopathy - check PPD.  Likely reactive lymphadenopathy secondary to pulmonary infarct.   Incidental Cholelithiasis noted on abdominal CT  Discharge Physical Exam:  General Appearance: Alert, cooperative, no distress, appears stated age  Weight change:  No intake or output data in the 24 hours ending 02/21/13 1349 Filed Vitals:   02/21/13 0153 02/21/13 0347  BP: 119/75 122/69  Pulse: 87   Temp: 98.2 F (36.8 C)   TempSrc: Oral   Resp: 16 16  Height: 5\' 11"  (1.803 m)   Weight: 81.647 kg (180 lb)   SpO2: 98% 99%   General: Well-built and nourished.  Eyes: Anicteric no pallor.    Cardiovascular: S1-S2 heard.  Respiratory: diminished breath sounds in right base with faint rales posteriorly. Clear on left. No wheezes or rhonchi  Musculoskeletal: Nontender. No effusion or edema.  Psychiatric: Appears normal.  Neurologic: Alert awake oriented to time place and person moves all extremities 5 x 5.  Discharge Condition:  Chest pain improved on anticoagulation therapy  Hospital Course: [name] is a 57 y.o. male with no significant past medical history has been experiencing right-sided pleuritic type of chest pain for last one month. Patient's symptoms started a month ago after he had gone for a visit to [place]. [place]  He did some diving and  went as far down as of 100 feet.  Patient went to the ER at that time and had his cardiac enzymes checked and since they were negative he was sent home. The pain has been persistent but worsened over the last 2 days with fatigue and shortness of breath and CT angiogram of the chest reveals bilateral pulmonary emboli with right pleural effusion and right lower lobe pulmonary infarct with hilar lymphadenopathy on the right and in the mid right upper lobe is a 6 millimeter nodule. Patient was started on intravenous heparin and also Xarelto was started and he symptomatically is feeling better already.  No signs of bleeding.  Still some pleuritic chest discomfort but improved.  Things to follow up in the outpatient setting:   Monitor for bleeding such as hemoptysis or bruising or hematuria or blood per rectum either melena or bright red blood per rectum.  Also need to followup on hilar adenopathy and pulmonary nodule  Consults: Treatment Team:  Md Pccm, MD  Disposition: 07-Left Against Medical Advice  Discharge Orders   Future Orders Complete By Expires     Call MD for:  difficulty breathing, headache or visual disturbances  As directed     Call MD for:  severe uncontrolled pain  As directed     Call MD for:  temperature >100.4  As directed     Diet - low sodium heart healthy  As directed     Discharge instructions  As directed     Comments:      Call [phone] or [phone] for  appt in 2 weeks with Dr. Kevan Ny.  Call M.D. For shortness of breath or blood in sputum or urine or stool or if feeling more fatigued than usual or if chest pain occurs.  Symptomatically he should be improving every several days.  Okay to take ibuprofen 400 milligrams with meals episodically for pain over the next 4-7 days but not chronically because of the Xarelto therapy    Increase activity slowly  As directed         Medication List    STOP taking these medications       OVER THE COUNTER MEDICATION      TAKE these  medications       Rivaroxaban 15 MG Tabs tablet  Commonly known as:  XARELTO  Take 1 tablet (15 mg total) by mouth 2 (two) times daily.         The results of significant diagnostics from this hospitalization (including imaging, microbiology, ancillary and laboratory) are listed below for reference.    Significant Diagnostic Studies: Dg Chest 2 View  02/21/2013  *RADIOLOGY REPORT*  Clinical Data: Right-sided chest pain on inspiration.  CHEST - 2 VIEW  Comparison: Chest radiograph performed 01/28/2013  Findings: The lungs are well-aerated and clear.  There is no evidence of focal opacification, pleural effusion or pneumothorax. Minimal nodularity at the right midlung zone is thought to be within the overlying rib.  The heart is normal in size; the mediastinal contour is within normal limits.  No acute osseous abnormalities are seen.  IMPRESSION: No acute cardiopulmonary process seen.   Original Report Authenticated By: Tonia Ghent, M.D.    Dg Chest 2 View  01/28/2013  *RADIOLOGY REPORT*  Clinical Data: Chest pain.  CHEST - 2 VIEW  Comparison: 07/30/2012  Findings: Heart and mediastinal contours are within normal limits. No focal opacities or effusions.  No acute bony abnormality.  IMPRESSION: No active cardiopulmonary disease.   Original Report Authenticated By: Charlett Nose, M.D.    Ct Angio Chest Pe W/cm &/or Wo Cm  02/21/2013  *RADIOLOGY REPORT*  Clinical Data: Chest pain and right flank pain; elevated D-dimer.  CT ANGIOGRAPHY CHEST,CT ABDOMEN WITH CONTRAST  CT ABDOMEN WITH CONTRAST  Technique:  Multidetector CT imaging of the chest using the standard protocol during bolus administration of intravenous contrast. Multiplanar reconstructed images including MIPs were obtained and reviewed to evaluate the vascular anatomy.,Technique: Mul  Multidetector CT imaging of the abdomen was performed using the standard protocol during bolus administration of intravenous contrast.  Contrast: OMNIPAQUE  IOHEXOL 350 MG/ML SOLN,  Comparison: Chest radiograph performed earlier today at 02:39 a.m.  CTA CHEST  Findings: A relatively small pulmonary embolus is noted within a pulmonary artery to a segment of the right lower lobe; there also appears to be minimal pulmonary embolus to the left lower lobe.  A small pulmonary infarct is noted at the right lower lobe, with associated small right pleural effusion.  There is no evidence of pneumothorax.  A 6 mm mildly lobulated nodule is noted at the right upper lobe (image 29 of 83).  Enlarged azygoesophageal recess, subcarinal and right hilar nodes are seen, measuring up to 1.5 cm in short axis.  No pericardial effusion is identified.  The great vessels are grossly unremarkable in appearance.  No axillary lymphadenopathy is seen.  The visualized portions of the thyroid gland are unremarkable in appearance.  No acute osseous abnormalities are seen.  IMPRESSION:  1.  Small pulmonary emboli noted within pulmonary arteries to both  lower lung lobes, more prominent on the right. 2.  Small pulmonary infarct at the right lower lobe, with associated small right pleural effusion. 3.  6 mm mildly lobulated nodule at the right upper lobe. If the patient is at high risk for bronchogenic carcinoma, follow-up chest CT at 6-12 months is recommended.  If the patient is at low risk for bronchogenic carcinoma, follow-up chest CT at 12 months is recommended.  This recommendation follows the consensus statement: Guidelines for Management of Small Pulmonary Nodules Detected on CT Scans: A Statement from the Fleischner Society as published in Radiology 2005; 237:395-400. 4.  Enlarged azygoesophageal recess, subcarinal and right hilar nodes, of uncertain significance.  The azygoesophageal recess node would be amenable to biopsy, if deemed clinically appropriate.  CT ABDOMEN  Findings: The liver and spleen are unremarkable in appearance. Increased attenuation layering dependently within the  gallbladder likely reflects stones and sludge.  The gallbladder is otherwise unremarkable in appearance.  The pancreas and adrenal glands are unremarkable.  The kidneys are unremarkable in appearance.  There is no evidence of hydronephrosis.  No renal or ureteral stones are seen.  No perinephric stranding is appreciated.  No free fluid is identified.  The visualized small and large bowel are unremarkable in appearance.  The stomach is within normal limits.  No acute vascular abnormalities are seen.  No acute osseous abnormalities are identified.  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  Review of the MIP images confirms the above findings.  IMPRESSION:  1.  Likely cholelithiasis; gallbladder otherwise unremarkable in appearance. 2.  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 3.  Otherwise unremarkable CT of the abdomen.  Critical Value/emergent results were called by telephone at the time of interpretation on 02/21/2013 at 03:22 a.m. to Dr. Deanna Artis, who verbally acknowledged these results.   Original Report Authenticated By: Tonia Ghent, M.D.    Ct Abdomen W Contrast  02/21/2013  *RADIOLOGY REPORT*  Clinical Data: Chest pain and right flank pain; elevated D-dimer.  CT ANGIOGRAPHY CHEST,CT ABDOMEN WITH CONTRAST  CT ABDOMEN WITH CONTRAST  Technique:  Multidetector CT imaging of the chest using the standard protocol during bolus administration of intravenous contrast. Multiplanar reconstructed images including MIPs were obtained and reviewed to evaluate the vascular anatomy.,Technique: Mul  Multidetector CT imaging of the abdomen was performed using the standard protocol during bolus administration of intravenous contrast.  Contrast: OMNIPAQUE IOHEXOL 350 MG/ML SOLN,  Comparison: Chest radiograph performed earlier today at 02:39 a.m.  CTA CHEST  Findings: A relatively small pulmonary embolus is noted within a pulmonary  artery to a segment of the right lower lobe; there also appears to be minimal pulmonary embolus to the left lower lobe.  A small pulmonary infarct is noted at the right lower lobe, with associated small right pleural effusion.  There is no evidence of pneumothorax.  A 6 mm mildly lobulated nodule is noted at the right upper lobe (image 29 of 83).  Enlarged azygoesophageal recess, subcarinal and right hilar nodes are seen, measuring up to 1.5 cm in short axis.  No pericardial effusion is identified.  The great vessels are grossly unremarkable in appearance.  No axillary lymphadenopathy is seen.  The visualized portions of the thyroid gland are unremarkable in appearance.  No acute osseous abnormalities are seen.  IMPRESSION:  1.  Small pulmonary emboli noted within pulmonary arteries to both lower lung lobes, more prominent on the right. 2.  Small pulmonary infarct at the right lower lobe, with associated small right pleural effusion. 3.  6 mm mildly lobulated nodule at the right upper lobe. If the patient is at high risk for bronchogenic carcinoma, follow-up chest CT at 6-12 months is recommended.  If the patient is at low risk for bronchogenic carcinoma, follow-up chest CT at 12 months is recommended.  This recommendation follows the consensus statement: Guidelines for Management of Small Pulmonary Nodules Detected on CT Scans: A Statement from the Fleischner Society as published in Radiology 2005; 237:395-400. 4.  Enlarged azygoesophageal recess, subcarinal and right hilar nodes, of uncertain significance.  The azygoesophageal recess node would be amenable to biopsy, if deemed clinically appropriate.  CT ABDOMEN  Findings: The liver and spleen are unremarkable in appearance. Increased attenuation layering dependently within the gallbladder likely reflects stones and sludge.  The gallbladder is otherwise unremarkable in appearance.  The pancreas and adrenal glands are unremarkable.  The kidneys are unremarkable in  appearance.  There is no evidence of hydronephrosis.  No renal or ureteral stones are seen.  No perinephric stranding is appreciated.  No free fluid is identified.  The visualized small and large bowel are unremarkable in appearance.  The stomach is within normal limits.  No acute vascular abnormalities are seen.  No acute osseous abnormalities are identified.  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  Review of the MIP images confirms the above findings.  IMPRESSION:  1.  Likely cholelithiasis; gallbladder otherwise unremarkable in appearance. 2.  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 3.  Otherwise unremarkable CT of the abdomen.  Critical Value/emergent results were called by telephone at the time of interpretation on 02/21/2013 at 03:22 a.m. to Dr. Deanna Artis, who verbally acknowledged these results.   Original Report Authenticated By: Tonia Ghent, M.D.     Microbiology: No results found for this or any previous visit (from the past 240 hour(s)).   Labs: Results for orders placed during the hospital encounter of 02/21/13  URINALYSIS, ROUTINE W REFLEX MICROSCOPIC      Result Value Range   Color, Urine YELLOW  YELLOW   APPearance CLEAR  CLEAR   Specific Gravity, Urine 1.019  1.005 - 1.030   pH 7.0  5.0 - 8.0   Glucose, UA NEGATIVE  NEGATIVE mg/dL   Hgb urine dipstick NEGATIVE  NEGATIVE   Bilirubin Urine NEGATIVE  NEGATIVE   Ketones, ur NEGATIVE  NEGATIVE mg/dL   Protein, ur NEGATIVE  NEGATIVE mg/dL   Urobilinogen, UA 1.0  0.0 - 1.0 mg/dL   Nitrite NEGATIVE  NEGATIVE   Leukocytes, UA NEGATIVE  NEGATIVE  CBC WITH DIFFERENTIAL      Result Value Range   WBC 10.7 (*) 4.0 - 10.5 K/uL   RBC 4.86  4.22 - 5.81 MIL/uL   Hemoglobin 14.8  13.0 - 17.0 g/dL   HCT 16.1  09.6 - 04.5 %   MCV 86.0  78.0 - 100.0 fL   MCH 30.5  26.0 - 34.0 pg   MCHC 35.4  30.0 - 36.0 g/dL   RDW 40.9  81.1 - 91.4 %   Platelets  180  150 - 400 K/uL   Neutrophils Relative 69  43 - 77 %   Neutro Abs 7.4  1.7 - 7.7 K/uL   Lymphocytes Relative 15  12 - 46 %   Lymphs Abs 1.6  0.7 - 4.0 K/uL   Monocytes Relative 13 (*)  3 - 12 %   Monocytes Absolute 1.4 (*) 0.1 - 1.0 K/uL   Eosinophils Relative 2  0 - 5 %   Eosinophils Absolute 0.2  0.0 - 0.7 K/uL   Basophils Relative 0  0 - 1 %   Basophils Absolute 0.0  0.0 - 0.1 K/uL  COMPREHENSIVE METABOLIC PANEL      Result Value Range   Sodium 137  135 - 145 mEq/L   Potassium 4.3  3.5 - 5.1 mEq/L   Chloride 101  96 - 112 mEq/L   CO2 26  19 - 32 mEq/L   Glucose, Bld 112 (*) 70 - 99 mg/dL   BUN 16  6 - 23 mg/dL   Creatinine, Ser 1.61  0.50 - 1.35 mg/dL   Calcium 9.8  8.4 - 09.6 mg/dL   Total Protein 7.4  6.0 - 8.3 g/dL   Albumin 4.1  3.5 - 5.2 g/dL   AST 24  0 - 37 U/L   ALT 28  0 - 53 U/L   Alkaline Phosphatase 103  39 - 117 U/L   Total Bilirubin 1.1  0.3 - 1.2 mg/dL   GFR calc non Af Amer 61 (*) >90 mL/min   GFR calc Af Amer 70 (*) >90 mL/min  TROPONIN I      Result Value Range   Troponin I <0.30  <0.30 ng/mL  D-DIMER, QUANTITATIVE      Result Value Range   D-Dimer, Quant 0.67 (*) 0.00 - 0.48 ug/mL-FEU  COMPREHENSIVE METABOLIC PANEL      Result Value Range   Sodium 140  135 - 145 mEq/L   Potassium 4.3  3.5 - 5.1 mEq/L   Chloride 101  96 - 112 mEq/L   CO2 27  19 - 32 mEq/L   Glucose, Bld 96  70 - 99 mg/dL   BUN 14  6 - 23 mg/dL   Creatinine, Ser 0.45  0.50 - 1.35 mg/dL   Calcium 9.8  8.4 - 40.9 mg/dL   Total Protein 7.0  6.0 - 8.3 g/dL   Albumin 4.0  3.5 - 5.2 g/dL   AST 21  0 - 37 U/L   ALT 26  0 - 53 U/L   Alkaline Phosphatase 104  39 - 117 U/L   Total Bilirubin 1.2  0.3 - 1.2 mg/dL   GFR calc non Af Amer 64 (*) >90 mL/min   GFR calc Af Amer 75 (*) >90 mL/min  CBC WITH DIFFERENTIAL      Result Value Range   WBC 9.0  4.0 - 10.5 K/uL   RBC 4.74  4.22 - 5.81 MIL/uL   Hemoglobin 14.7  13.0 - 17.0 g/dL   HCT 81.1  91.4 - 78.2 %   MCV 85.2  78.0 - 100.0 fL    MCH 31.0  26.0 - 34.0 pg   MCHC 36.4 (*) 30.0 - 36.0 g/dL   RDW 95.6  21.3 - 08.6 %   Platelets 166  150 - 400 K/uL   Neutrophils Relative 62  43 - 77 %   Neutro Abs 5.5  1.7 - 7.7 K/uL   Lymphocytes Relative 22  12 - 46 %   Lymphs Abs 2.0  0.7 - 4.0 K/uL   Monocytes Relative 13 (*) 3 - 12 %   Monocytes Absolute 1.2 (*) 0.1 - 1.0 K/uL   Eosinophils Relative 3  0 - 5 %   Eosinophils Absolute 0.2  0.0 - 0.7 K/uL   Basophils Relative  1  0 - 1 %   Basophils Absolute 0.1  0.0 - 0.1 K/uL  PROTIME-INR      Result Value Range   Prothrombin Time 15.5 (*) 11.6 - 15.2 seconds   INR 1.25  0.00 - 1.49  TROPONIN I      Result Value Range   Troponin I <0.30  <0.30 ng/mL    Time coordinating discharge: 35 minutes  Signed: Pearla Dubonnet, MD 02/21/2013, 1:49 PM

## 2013-02-21 NOTE — Progress Notes (Signed)
Pt d/c instructions reviewed with pt and wife. Copy of instructions and script given to pt. PPD placed on pt's right forearm as ordered. Pt instructed to have PPD read within next 48-72hrs. Dr Kevan Ny and Dr Sung Amabile aware that pt is having pt's brother who is a medical physician or his son that is a Psychologist, occupational to read pt's PPD within timeframe. Pt will report to Dr Kevan Ny the information over next 48-72hrs and on Monday. Pt d/c'd with belongings with wife, escorted by unit staff via wheelchair.

## 2013-02-21 NOTE — ED Notes (Signed)
Pt c/o intermittent right flank and right chest pain x1 month that became worse over the last 2 days.

## 2013-02-21 NOTE — H&P (Signed)
[facility] History and Physical  Jeffrey Adkins:[id number] DOB: 06/28/1956 DOA: 02/21/2013  Referring physician: Dr.Palumbo. PCP: Pcp Not In System Dr.Robert Johnella Moloney. Specialists: None.  Chief Complaint: Right-sided chest pain with shortness of breath.  HPI: Jeffrey Adkins is a 57 y.o. male with no significant past medical history has been experiencing right-sided pleuritic type of chest pain for last one month. Patient's symptoms started a month ago after he had gone for a visit to [place]. Patient states that he had come to the ER at that time and recheck his cardiac enzymes and since it was negative he was sent home. The pain has been persistent but not worsening over the last 2 days with fatigue and shortness of breath. This time in the ER they did CT angiogram which showed patent palmar embolism with infarct and mild pleural effusion and also pulmonary nodules with lymphadenopathy. Patient has been admitted for further management. Patient is hemodynamically stable. Patient otherwise denies any nausea vomiting abdominal pain diarrhea loss of weight fever chills.  Review of Systems: As presented in the history of presenting illness nothing else significant.  Past Medical History  Diagnosis Date  . Medical history non-contributory    Past Surgical History  Procedure Laterality Date  . Back surgery    . Knee surgery    . Ankle surgery     Social History:  reports that he has never smoked. He does not have any smokeless tobacco history on file. He reports that he does not drink alcohol or use illicit drugs. Lives at home with his wife. where does patient live--home, ALF, SNF? and with whom if at home? Can do ADLs. Can patient participate in ADLs?  Allergies  Allergen Reactions  . Hydrocodone Hives  . Penicillins Hives    Family History  Problem Relation Age of Onset  . Hypertension Mother       Prior to Admission medications   Not on File   Physical  Exam: Filed Vitals:   02/21/13 0153 02/21/13 0347  BP: 119/75 122/69  Pulse: 87   Temp: 98.2 F (36.8 C)   TempSrc: Oral   Resp: 16 16  Height: 5\' 11"  (1.803 m)   Weight: 81.647 kg (180 lb)   SpO2: 98% 99%     General:  Well-built and nourished.  Eyes: Anicteric no pallor.  ENT: No discharge from the ears eyes nose and mouth.  Neck: No mass felt.  Cardiovascular: S1-S2 heard.  Respiratory: No rhonchi or crepitations.  Abdomen: Soft nontender bowel sounds present.  Skin: Small rash on the left forearm.  Musculoskeletal: Nontender. No effusion or edema.  Psychiatric: Appears normal.  Neurologic: Alert awake oriented to time place and person moves all extremities 5 x 5.  Labs on Admission:  Basic Metabolic Panel:  Recent Labs Lab 02/21/13 0203  NA 137  K 4.3  CL 101  CO2 26  GLUCOSE 112*  BUN 16  CREATININE 1.30  CALCIUM 9.8   Liver Function Tests:  Recent Labs Lab 02/21/13 0203  AST 24  ALT 28  ALKPHOS 103  BILITOT 1.1  PROT 7.4  ALBUMIN 4.1   No results found for this basename: LIPASE, AMYLASE,  in the last 168 hours No results found for this basename: AMMONIA,  in the last 168 hours CBC:  Recent Labs Lab 02/21/13 0203  WBC 10.7*  NEUTROABS 7.4  HGB 14.8  HCT 41.8  MCV 86.0  PLT 180   Cardiac Enzymes:  Recent Labs Lab  02/21/13 0203  TROPONINI <0.30    BNP (last 3 results)  Recent Labs  01/28/13 1545  PROBNP 62.2   CBG: No results found for this basename: GLUCAP,  in the last 168 hours  Radiological Exams on Admission: Dg Chest 2 View  02/21/2013  *RADIOLOGY REPORT*  Clinical Data: Right-sided chest pain on inspiration.  CHEST - 2 VIEW  Comparison: Chest radiograph performed 01/28/2013  Findings: The lungs are well-aerated and clear.  There is no evidence of focal opacification, pleural effusion or pneumothorax. Minimal nodularity at the right midlung zone is thought to be within the overlying rib.  The heart is normal in  size; the mediastinal contour is within normal limits.  No acute osseous abnormalities are seen.  IMPRESSION: No acute cardiopulmonary process seen.   Original Report Authenticated By: Tonia Ghent, M.D.    Ct Angio Chest Pe W/cm &/or Wo Cm  02/21/2013  *RADIOLOGY REPORT*  Clinical Data: Chest pain and right flank pain; elevated D-dimer.  CT ANGIOGRAPHY CHEST,CT ABDOMEN WITH CONTRAST  CT ABDOMEN WITH CONTRAST  Technique:  Multidetector CT imaging of the chest using the standard protocol during bolus administration of intravenous contrast. Multiplanar reconstructed images including MIPs were obtained and reviewed to evaluate the vascular anatomy.,Technique: Mul  Multidetector CT imaging of the abdomen was performed using the standard protocol during bolus administration of intravenous contrast.  Contrast: OMNIPAQUE IOHEXOL 350 MG/ML SOLN,  Comparison: Chest radiograph performed earlier today at 02:39 a.m.  CTA CHEST  Findings: A relatively small pulmonary embolus is noted within a pulmonary artery to a segment of the right lower lobe; there also appears to be minimal pulmonary embolus to the left lower lobe.  A small pulmonary infarct is noted at the right lower lobe, with associated small right pleural effusion.  There is no evidence of pneumothorax.  A 6 mm mildly lobulated nodule is noted at the right upper lobe (image 29 of 83).  Enlarged azygoesophageal recess, subcarinal and right hilar nodes are seen, measuring up to 1.5 cm in short axis.  No pericardial effusion is identified.  The great vessels are grossly unremarkable in appearance.  No axillary lymphadenopathy is seen.  The visualized portions of the thyroid gland are unremarkable in appearance.  No acute osseous abnormalities are seen.  IMPRESSION:  1.  Small pulmonary emboli noted within pulmonary arteries to both lower lung lobes, more prominent on the right. 2.  Small pulmonary infarct at the right lower lobe, with associated small right  pleural effusion. 3.  6 mm mildly lobulated nodule at the right upper lobe. If the patient is at high risk for bronchogenic carcinoma, follow-up chest CT at 6-12 months is recommended.  If the patient is at low risk for bronchogenic carcinoma, follow-up chest CT at 12 months is recommended.  This recommendation follows the consensus statement: Guidelines for Management of Small Pulmonary Nodules Detected on CT Scans: A Statement from the Fleischner Society as published in Radiology 2005; 237:395-400. 4.  Enlarged azygoesophageal recess, subcarinal and right hilar nodes, of uncertain significance.  The azygoesophageal recess node would be amenable to biopsy, if deemed clinically appropriate.  CT ABDOMEN  Findings: The liver and spleen are unremarkable in appearance. Increased attenuation layering dependently within the gallbladder likely reflects stones and sludge.  The gallbladder is otherwise unremarkable in appearance.  The pancreas and adrenal glands are unremarkable.  The kidneys are unremarkable in appearance.  There is no evidence of hydronephrosis.  No renal or ureteral stones are seen.  No perinephric stranding is appreciated.  No free fluid is identified.  The visualized small and large bowel are unremarkable in appearance.  The stomach is within normal limits.  No acute vascular abnormalities are seen.  No acute osseous abnormalities are identified.  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  Review of the MIP images confirms the above findings.  IMPRESSION:  1.  Likely cholelithiasis; gallbladder otherwise unremarkable in appearance. 2.  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 3.  Otherwise unremarkable CT of the abdomen.  Critical Value/emergent results were called by telephone at the time of interpretation on 02/21/2013 at 03:22 a.m. to Dr. Deanna Artis, who verbally acknowledged these results.   Original Report  Authenticated By: Tonia Ghent, M.D.    Ct Abdomen W Contrast  02/21/2013  *RADIOLOGY REPORT*  Clinical Data: Chest pain and right flank pain; elevated D-dimer.  CT ANGIOGRAPHY CHEST,CT ABDOMEN WITH CONTRAST  CT ABDOMEN WITH CONTRAST  Technique:  Multidetector CT imaging of the chest using the standard protocol during bolus administration of intravenous contrast. Multiplanar reconstructed images including MIPs were obtained and reviewed to evaluate the vascular anatomy.,Technique: Mul  Multidetector CT imaging of the abdomen was performed using the standard protocol during bolus administration of intravenous contrast.  Contrast: OMNIPAQUE IOHEXOL 350 MG/ML SOLN,  Comparison: Chest radiograph performed earlier today at 02:39 a.m.  CTA CHEST  Findings: A relatively small pulmonary embolus is noted within a pulmonary artery to a segment of the right lower lobe; there also appears to be minimal pulmonary embolus to the left lower lobe.  A small pulmonary infarct is noted at the right lower lobe, with associated small right pleural effusion.  There is no evidence of pneumothorax.  A 6 mm mildly lobulated nodule is noted at the right upper lobe (image 29 of 83).  Enlarged azygoesophageal recess, subcarinal and right hilar nodes are seen, measuring up to 1.5 cm in short axis.  No pericardial effusion is identified.  The great vessels are grossly unremarkable in appearance.  No axillary lymphadenopathy is seen.  The visualized portions of the thyroid gland are unremarkable in appearance.  No acute osseous abnormalities are seen.  IMPRESSION:  1.  Small pulmonary emboli noted within pulmonary arteries to both lower lung lobes, more prominent on the right. 2.  Small pulmonary infarct at the right lower lobe, with associated small right pleural effusion. 3.  6 mm mildly lobulated nodule at the right upper lobe. If the patient is at high risk for bronchogenic carcinoma, follow-up chest CT at 6-12 months is recommended.   If the patient is at low risk for bronchogenic carcinoma, follow-up chest CT at 12 months is recommended.  This recommendation follows the consensus statement: Guidelines for Management of Small Pulmonary Nodules Detected on CT Scans: A Statement from the Fleischner Society as published in Radiology 2005; 237:395-400. 4.  Enlarged azygoesophageal recess, subcarinal and right hilar nodes, of uncertain significance.  The azygoesophageal recess node would be amenable to biopsy, if deemed clinically appropriate.  CT ABDOMEN  Findings: The liver and spleen are unremarkable in appearance. Increased attenuation layering dependently within the gallbladder likely reflects stones and sludge.  The gallbladder is otherwise unremarkable in appearance.  The pancreas and adrenal glands are unremarkable.  The kidneys are unremarkable in appearance.  There is no evidence of hydronephrosis.  No renal or ureteral stones are seen.  No perinephric stranding is appreciated.  No free fluid  is identified.  The visualized small and large bowel are unremarkable in appearance.  The stomach is within normal limits.  No acute vascular abnormalities are seen.  No acute osseous abnormalities are identified.  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  Review of the MIP images confirms the above findings.  IMPRESSION:  1.  Likely cholelithiasis; gallbladder otherwise unremarkable in appearance. 2.  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 3.  Otherwise unremarkable CT of the abdomen.  Critical Value/emergent results were called by telephone at the time of interpretation on 02/21/2013 at 03:22 a.m. to Dr. Deanna Artis, who verbally acknowledged these results.   Original Report Authenticated By: Tonia Ghent, M.D.      Assessment/Plan Principal Problem:   Pulmonary embolism Active Problems:   Pulmonary nodule   Mediastinal  lymphadenopathy   1. Pulmonary embolism, hemodynamically stable - patient has been started on heparin infusion which we will continue for now as I have also consulted Pulmonary team for their opinion on patient's lung volumes with lymphadenopathy if in case patient requires biopsy. If biopsy is not planned then may change to xarelto. 2. Pulmonary nodules and lymphadenopathy - pulmonary consulted. Further recommendations per them.  Pulmonary consulted. if consultant consulted, please document name and whether formally or informally consulted  Code Status: Full code. Family Communication: None at the bedside. Disposition Plan: Admit to inpatient.   KAKRAKANDY,ARSHAD N. [facility] Pager [phone].  If 7PM-7AM, please contact night-coverage [email or web address] Password [facility] 02/21/2013, 6:26 AM

## 2013-08-22 IMAGING — CR DG CHEST 2V
2 series · 2 of 2 positions shown · non-contrast
Comparison: Chest radiograph performed 01/28/2013

CLINICAL DATA: Right-sided chest pain on inspiration.

CHEST - 2 VIEW

[w chest pa]
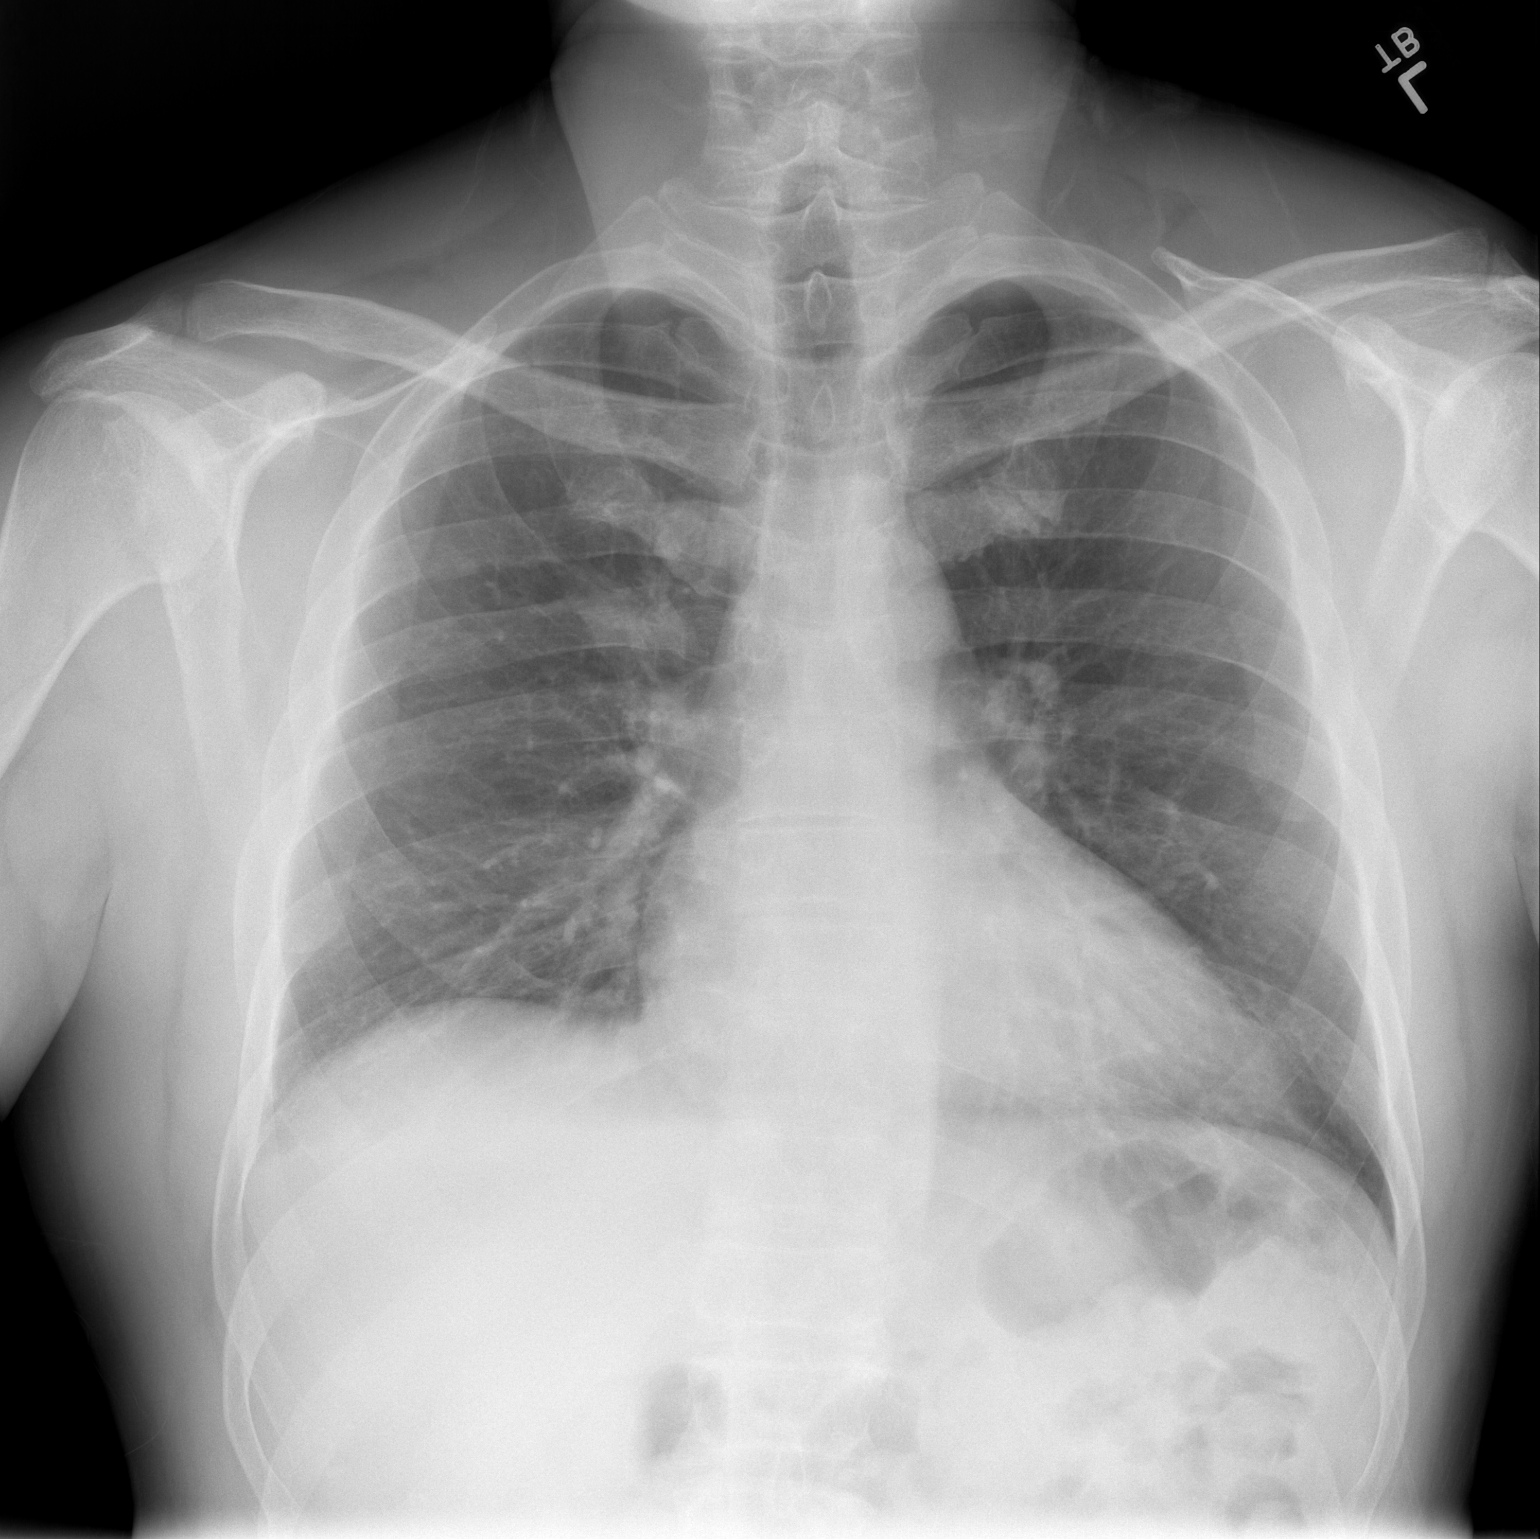

[w chest lat]
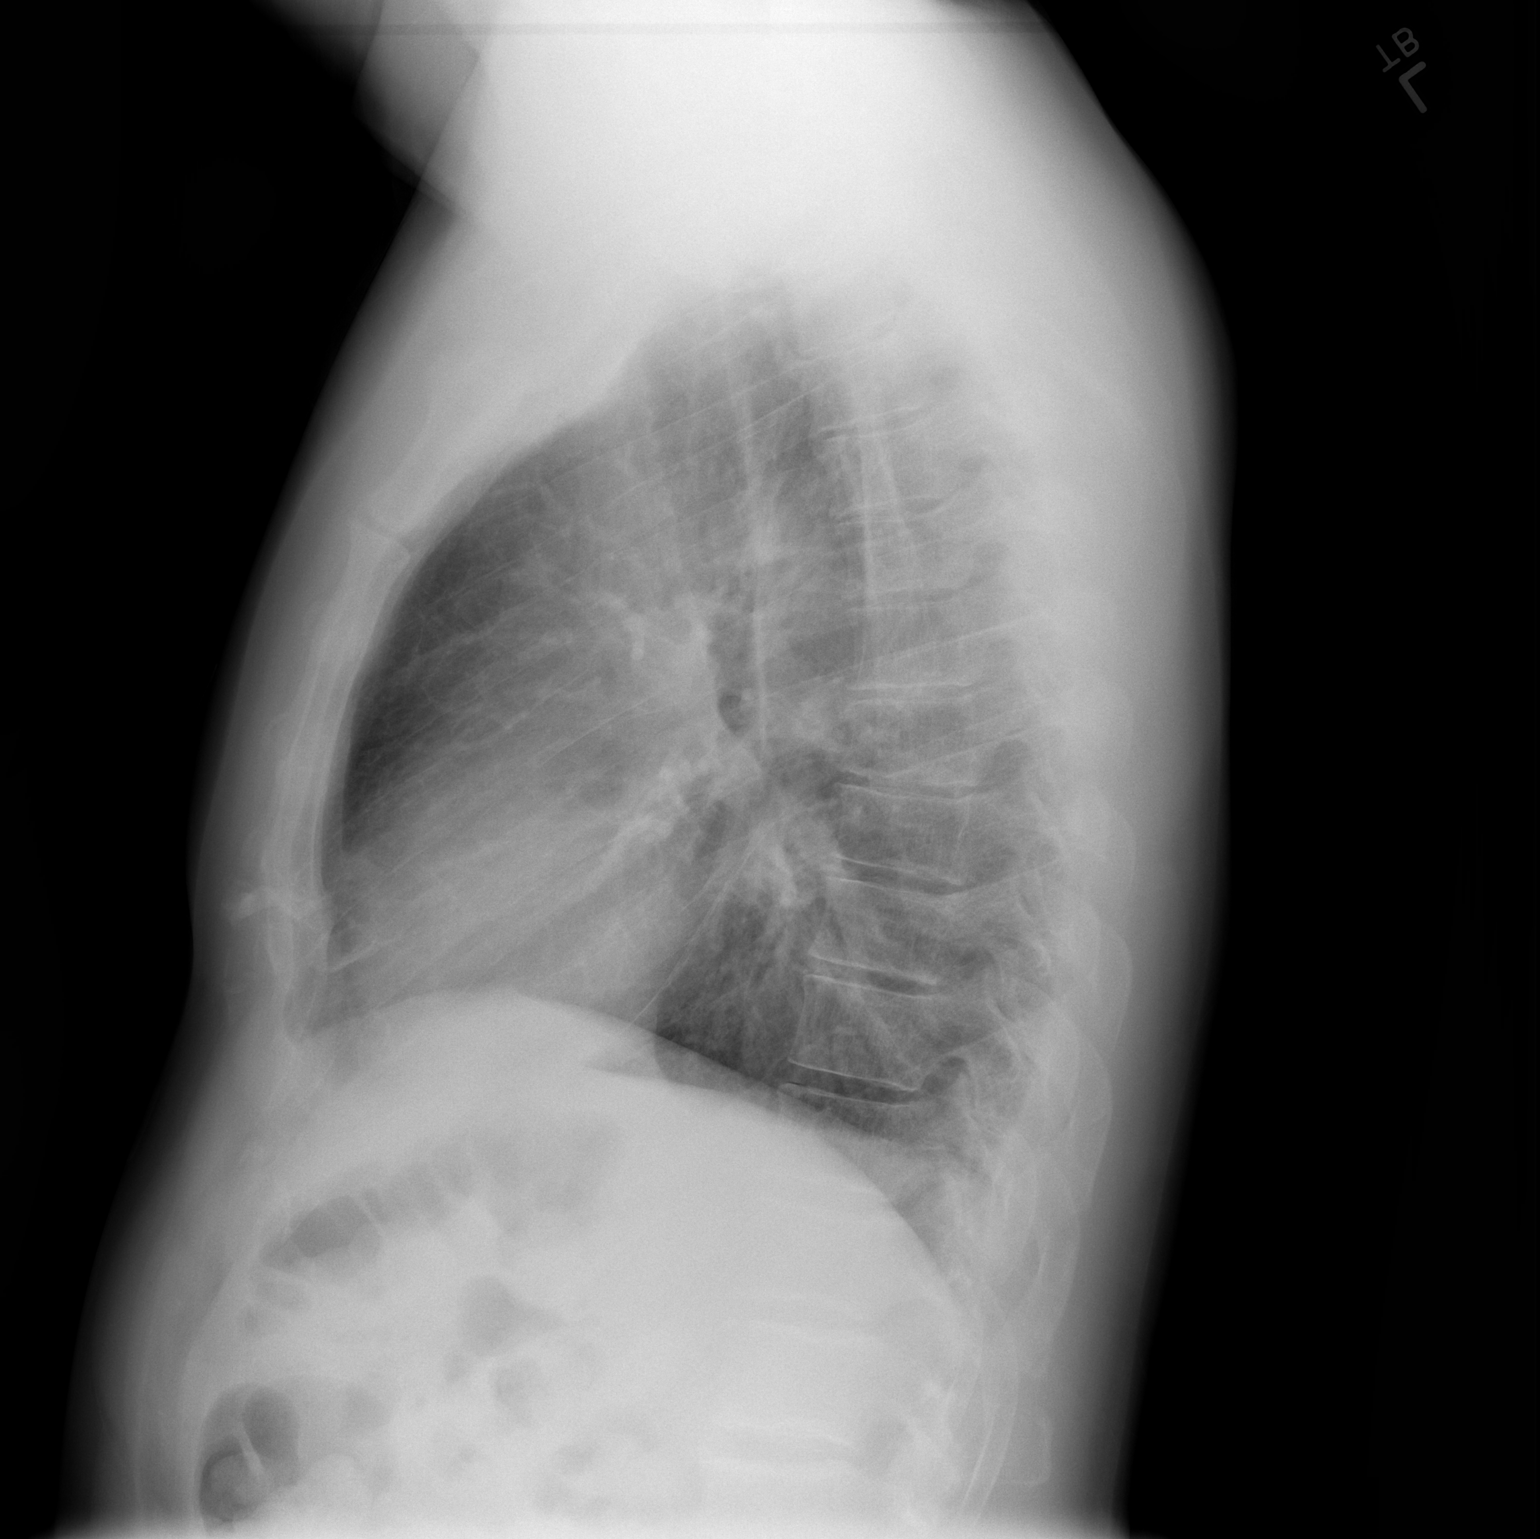

[2 of 2 positions shown; findings below may reference images not displayed]

FINDINGS: The lungs are well-aerated and clear.  There is no
evidence of focal opacification, pleural effusion or pneumothorax.
Minimal nodularity at the right midlung zone is thought to be
within the overlying rib.

The heart is normal in size; the mediastinal contour is within
normal limits.  No acute osseous abnormalities are seen.
IMPRESSION: No acute cardiopulmonary process seen.

## 2014-02-26 ENCOUNTER — Ambulatory Visit
Admission: RE | Admit: 2014-02-26 | Discharge: 2014-02-26 | Disposition: A | Discharge status: Home / Self Care | Payer: 59 | Source: Ambulatory Visit | Attending: Internal Medicine | Admitting: Internal Medicine

## 2014-02-26 ENCOUNTER — Other Ambulatory Visit: Payer: Self-pay | Admitting: Internal Medicine

## 2014-02-26 DIAGNOSIS — R911 Solitary pulmonary nodule: Secondary | ICD-10-CM

## 2014-02-28 ENCOUNTER — Other Ambulatory Visit (HOSPITAL_COMMUNITY): Payer: Self-pay | Admitting: Internal Medicine

## 2014-02-28 DIAGNOSIS — R918 Other nonspecific abnormal finding of lung field: Secondary | ICD-10-CM

## 2014-03-06 ENCOUNTER — Ambulatory Visit (HOSPITAL_COMMUNITY)
Admission: RE | Admit: 2014-03-06 | Discharge: 2014-03-06 | Disposition: A | Discharge status: Home / Self Care | Payer: 59 | Source: Ambulatory Visit | Attending: Internal Medicine | Admitting: Internal Medicine

## 2014-03-06 DIAGNOSIS — R918 Other nonspecific abnormal finding of lung field: Secondary | ICD-10-CM | POA: Insufficient documentation

## 2014-03-06 DIAGNOSIS — K8020 Calculus of gallbladder without cholecystitis without obstruction: Secondary | ICD-10-CM | POA: Insufficient documentation

## 2014-03-06 DIAGNOSIS — R599 Enlarged lymph nodes, unspecified: Secondary | ICD-10-CM | POA: Insufficient documentation

## 2014-03-06 DIAGNOSIS — R229 Localized swelling, mass and lump, unspecified: Secondary | ICD-10-CM | POA: Insufficient documentation

## 2014-03-06 LAB — GLUCOSE, CAPILLARY: Glucose-Capillary: 94 mg/dL (ref 70–99)

## 2014-03-06 MED ORDER — FLUDEOXYGLUCOSE F - 18 (FDG) INJECTION
9.7000 | Freq: Once | INTRAVENOUS | Status: AC | PRN
  Administered 2014-03-06: 9.7 via INTRAVENOUS

## 2014-03-11 ENCOUNTER — Encounter: Payer: Self-pay | Admitting: Emergency Medicine

## 2014-03-11 ENCOUNTER — Ambulatory Visit (INDEPENDENT_AMBULATORY_CARE_PROVIDER_SITE_OTHER): Payer: 59 | Admitting: Emergency Medicine

## 2014-03-11 VITALS — BP 140/96 | HR 62 | Ht 71.0 in | Wt 185.4 lb

## 2014-03-11 DIAGNOSIS — R911 Solitary pulmonary nodule: Secondary | ICD-10-CM

## 2014-03-11 NOTE — Assessment & Plan Note (Signed)
Interesting case. There is no nodal enlargement but there is hypermetabolism of both the central nodes and the RUL nodule. The nodule has enlarged and it will likely need to be resected. I could perform EBUS and / or ENB, but in all likelihood he needs a wedge or a lobectomy. The only circumstance under which he wouldn't need this would be if we found metastatic cancer in his nodes. I think he should have a mediastinoscopy with plans for nodule resection depending on the results. If the nodes are clean or if they show granulomatous inflammation then he needs to have the nodule removed. If the nodes show cancer, then the nodule doesn't need to be removed.

## 2014-03-11 NOTE — Patient Instructions (Signed)
Your CT scan and PET scan show hypermetabolic lymph nodes in the chest and a pulmonary nodule. We will refer you to see Thoracic Surgery for probable mediastinoscopy and possible nodule resection.  Follow with Dr Lamonte Sakai as needed

## 2014-03-11 NOTE — Progress Notes (Signed)
Subjective:    Patient ID: Jeffrey Adkins, male    DOB: [birth year], 58 y.o.   MRN: [id number]  HPI 58 yo never smoker, childhood asthma, hx PE a year ago formerly on xarelto. He was found to have a RUL nodule on CT scan when the PE was dx 3/'14 > 1mm RUL nodule. He had repeat CT scan 02/26/14, PET 02/28/14 that showed the nodule to be increased in size, slightly hypermetabolic on PET. Interestingly also has hypermetabolic mediastinal nodes that are not enlarged. He is asymptomatic.     Review of Systems  Constitutional: Negative for fever and unexpected weight change.  HENT: Positive for trouble swallowing. Negative for congestion, dental problem, ear pain, nosebleeds, postnasal drip, rhinorrhea, sinus pressure, sneezing and sore throat.   Eyes: Negative for redness and itching.  Respiratory: Positive for cough, chest tightness and shortness of breath. Negative for wheezing.   Cardiovascular: Positive for chest pain. Negative for palpitations and leg swelling.  Gastrointestinal: Negative for nausea and vomiting.  Genitourinary: Negative for dysuria.  Musculoskeletal: Positive for joint swelling.  Skin: Positive for rash.       Red bumps on stomach area x1 year. Does not itch  Neurological: Negative for headaches.  Hematological: Does not bruise/bleed easily.  Psychiatric/Behavioral: Negative for dysphoric mood. The patient is not nervous/anxious.     Past Medical History  Diagnosis Date  . Pulmonary embolism   . Childhood asthma   . Allergic rhinitis   . Solitary pulmonary nodule 02/21/13    CT-PA performed 02/21/13. Enlarging on repeat CT scan 02/26/14. PET performed 02/28/14.      Family History  Problem Relation Age of Onset  . Hypertension Mother      History   Social History  . Marital Status: Married    Spouse Name: N/A    Number of Children: N/A  . Years of Education: N/A   Occupational History  . Not on file.   Social History Main Topics  . Smoking status: Never  Smoker   . Smokeless tobacco: Not on file  . Alcohol Use: No  . Drug Use: No  . Sexual Activity: Not on file   Other Topics Concern  . Not on file   Social History Narrative  . 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  He is a Architectural technologist now.  Has been to [place], [facility]  Allergies  Allergen Reactions  . Peanut-Containing Drug Products Anaphylaxis    All nuts  . Hydrocodone Hives  . Madelaine Bhat Isothiocyanate]     other  . Penicillins Hives  . Eggs Or Egg-Derived Products Rash     Outpatient Prescriptions Prior to Visit  Medication Sig Dispense Refill  . Rivaroxaban (XARELTO) 15 MG TABS tablet Take 1 tablet (15 mg total) by mouth 2 (two) times daily.  60 tablet  1   No facility-administered medications prior to visit.         Objective:   Physical Exam Filed Vitals:   03/11/14 1618  BP: 140/96  Pulse: 62  Height: 5\' 11"  (1.803 m)  Weight: 185 lb 6.4 oz (84.097 kg)  SpO2: 99%   Gen: Pleasant, well-nourished, in no distress,  normal affect  ENT: No lesions,  mouth clear,  oropharynx clear, no postnasal drip  Neck: No JVD, no TMG, no carotid bruits  Lungs: No use of accessory muscles, clear without rales or rhonchi  Cardiovascular: RRR, heart sounds normal, no murmur or gallops, no peripheral edema  Musculoskeletal: No deformities, no cyanosis or clubbing  Neuro: alert, non focal  Skin: Warm, no lesions or rashes     Assessment & Plan:  Pulmonary nodule Interesting case. There is no nodal enlargement but there is hypermetabolism of both the central nodes and the RUL nodule. The nodule has enlarged and it will likely need to be resected. I could perform EBUS and / or ENB, but in all likelihood he needs a wedge or a lobectomy. The only circumstance under which he wouldn't need this would be if we found metastatic cancer in his nodes. I think he should have a mediastinoscopy  with plans for nodule resection depending on the results. If the nodes are clean or if they show granulomatous inflammation then he needs to have the nodule removed. If the nodes show cancer, then the nodule doesn't need to be removed.

## 2014-03-12 ENCOUNTER — Telehealth: Payer: Self-pay | Admitting: Emergency Medicine

## 2014-03-12 ENCOUNTER — Ambulatory Visit: Payer: Self-pay | Admitting: Oncology

## 2014-03-12 ENCOUNTER — Other Ambulatory Visit: Payer: Self-pay | Admitting: Emergency Medicine

## 2014-03-12 DIAGNOSIS — R911 Solitary pulmonary nodule: Secondary | ICD-10-CM

## 2014-03-12 NOTE — Telephone Encounter (Signed)
Advised pt Jeffrey Adkins would call him shortly to get thoracic surgeon appointment schedule. Nothing further needed

## 2014-03-13 ENCOUNTER — Ambulatory Visit: Payer: Self-pay | Admitting: Oncology

## 2014-03-13 LAB — CBC CANCER CENTER
BASOS ABS: 0 x10 3/mm (ref 0.0–0.1)
Basophil %: 0.6 %
EOS ABS: 0.1 x10 3/mm (ref 0.0–0.7)
EOS PCT: 0.7 %
HCT: 44.2 % (ref 40.0–52.0)
HGB: 14.9 g/dL (ref 13.0–18.0)
LYMPHS ABS: 1 x10 3/mm (ref 1.0–3.6)
Lymphocyte %: 11.9 %
MCH: 29.7 pg (ref 26.0–34.0)
MCHC: 33.7 g/dL (ref 32.0–36.0)
MCV: 88 fL (ref 80–100)
Monocyte #: 0.6 x10 3/mm (ref 0.2–1.0)
Monocyte %: 6.7 %
NEUTROS PCT: 80.1 %
Neutrophil #: 6.8 x10 3/mm — ABNORMAL HIGH (ref 1.4–6.5)
Platelet: 187 x10 3/mm (ref 150–440)
RBC: 5.02 10*6/uL (ref 4.40–5.90)
RDW: 13.1 % (ref 11.5–14.5)
WBC: 8.5 x10 3/mm (ref 3.8–10.6)

## 2014-03-13 LAB — COMPREHENSIVE METABOLIC PANEL
ALK PHOS: 100 U/L
ALT: 41 U/L (ref 12–78)
ANION GAP: 7 (ref 7–16)
Albumin: 4 g/dL (ref 3.4–5.0)
BILIRUBIN TOTAL: 0.8 mg/dL (ref 0.2–1.0)
BUN: 29 mg/dL — ABNORMAL HIGH (ref 7–18)
CO2: 31 mmol/L (ref 21–32)
CREATININE: 1.15 mg/dL (ref 0.60–1.30)
Calcium, Total: 9.3 mg/dL (ref 8.5–10.1)
Chloride: 103 mmol/L (ref 98–107)
GLUCOSE: 87 mg/dL (ref 65–99)
Osmolality: 286 (ref 275–301)
Potassium: 4.5 mmol/L (ref 3.5–5.1)
SGOT(AST): 22 U/L (ref 15–37)
Sodium: 141 mmol/L (ref 136–145)
TOTAL PROTEIN: 7.1 g/dL (ref 6.4–8.2)

## 2014-03-13 LAB — SEDIMENTATION RATE: Erythrocyte Sed Rate: 3 mm/hr (ref 0–20)

## 2014-03-13 LAB — PROTIME-INR
INR: 1.1
Prothrombin Time: 13.9 secs (ref 11.5–14.7)

## 2014-03-13 LAB — LACTATE DEHYDROGENASE: LDH: 148 U/L (ref 85–241)

## 2014-03-13 LAB — APTT: ACTIVATED PTT: 26.4 s (ref 23.6–35.9)

## 2014-03-17 ENCOUNTER — Ambulatory Visit: Payer: Self-pay | Admitting: Internal Medicine

## 2014-03-18 ENCOUNTER — Encounter: Payer: 59 | Admitting: Thoracic Surgery (Cardiothoracic Vascular Surgery)

## 2014-03-19 ENCOUNTER — Ambulatory Visit: Payer: Self-pay | Admitting: Oncology

## 2014-03-20 LAB — PATHOLOGY REPORT

## 2014-03-25 ENCOUNTER — Other Ambulatory Visit: Payer: Self-pay | Admitting: *Deleted

## 2014-03-25 ENCOUNTER — Institutional Professional Consult (permissible substitution) (INDEPENDENT_AMBULATORY_CARE_PROVIDER_SITE_OTHER): Payer: 59 | Admitting: Thoracic Surgery (Cardiothoracic Vascular Surgery)

## 2014-03-25 ENCOUNTER — Encounter: Payer: Self-pay | Admitting: Thoracic Surgery (Cardiothoracic Vascular Surgery)

## 2014-03-25 VITALS — BP 123/79 | HR 66 | Resp 16 | Ht 71.0 in | Wt 176.0 lb

## 2014-03-25 DIAGNOSIS — R590 Localized enlarged lymph nodes: Secondary | ICD-10-CM

## 2014-03-25 DIAGNOSIS — R911 Solitary pulmonary nodule: Secondary | ICD-10-CM

## 2014-03-25 DIAGNOSIS — D381 Neoplasm of uncertain behavior of trachea, bronchus and lung: Secondary | ICD-10-CM

## 2014-03-25 NOTE — Progress Notes (Signed)
PCP is Jeffrey Screws, MD Referring Provider is Collene Gobble, MD  Chief Complaint  Patient presents with  . Lung Lesion    RUL and LLL....CT CHEST/PET    HPI: 58 year old man sent for evaluation of mediastinal adenopathy and right upper lobe lung nodule.  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 He had a pulmonary embolus a year ago. He did not have any known risk factors other than having traveled to [place] about a month before the diagnosis was made. On the CT angiogram that documented the pulmonary emboli, he also was noted to have a 6 mm right upper lobe nodule. He also was noted to have some mildly enlarged hilar and mediastinal lymph nodes.He was supposed to have had a PPD placed prior to discharge, but it is unclear if that was ever done.  He had a followup CT done on March 11 of this year. It showed a nodular increased in size from about 6 mm to 8.2 x 9.2 mm. His lymph nodes were the same to slightly smaller. A PET CT showed the right upper lobe nodule to be mildly hypermetabolic with an SUV of 2.1. His paratracheal and subcarinal nodes were significantly hypermetabolic, despite being smaller in size.  He saw Dr. Lamonte Sakai who recommended surgery. He saw Dr. Mortimer Fries in [place] who performed and endobronchial ultrasound.  He works as an Museum/gallery exhibitions officer. He has never smoked. He exercises on a regular basis, and engages in fairly extreme exercise. He has noted decreased energy and also a 9 pound weight loss over the past 3 months. He says that his appetite is good. He does get short of breath with exertion but only with rather extreme exertion. He has a lifelong history of asthma and occasional wheezing, but he is not taking any medications for that.  ECOG/ZUBROD= 0   Past Medical History  Diagnosis Date  . Pulmonary embolism   . Childhood asthma   . Allergic rhinitis   . Solitary pulmonary nodule 02/21/13    CT-PA performed  02/21/13. Enlarging on repeat CT scan 02/26/14. PET performed 02/28/14.     Past Surgical History  Procedure Laterality Date  . Back surgery    . Knee surgery    . Ankle surgery      Family History  Problem Relation Age of Onset  . Hypertension Mother     Social History History  Substance Use Topics  . Smoking status: Never Smoker   . Smokeless tobacco: Not on file  . Alcohol Use: No    No current outpatient prescriptions on file.   No current facility-administered medications for this visit.    Allergies  Allergen Reactions  . Peanut-Containing Drug Products Anaphylaxis    All nuts  . Hydrocodone Hives  . Madelaine Bhat Isothiocyanate]     other  . Penicillins Hives  . Eggs Or Egg-Derived Products Rash    Review of Systems  Constitutional: Positive for diaphoresis (Night sweats), fatigue (decreased energy) and unexpected weight change. Negative for fever and chills.  Respiratory: Positive for cough (nonproductive), shortness of breath (with heavy exertion) and wheezing (Rarely).   Gastrointestinal:       Reflux  Musculoskeletal: Positive for arthralgias and joint swelling.  All other systems reviewed and are negative.    BP 123/79  Pulse 66  Resp 16  Ht 5\' 11"  (1.803 m)  Wt 176 lb (79.833 kg)  BMI 24.56 kg/m2  SpO2 99% Physical  Exam  Vitals reviewed. Constitutional: He is oriented to person, place, and time. He appears well-developed and well-nourished. No distress.  HENT:  Head: Normocephalic and atraumatic.  Eyes: EOM are normal. Pupils are equal, round, and reactive to light.  Neck: Neck supple. No thyromegaly present.  Cardiovascular: Normal rate, regular rhythm, normal heart sounds and intact distal pulses.  Exam reveals no gallop and no friction rub.   No murmur heard. Pulmonary/Chest: Effort normal and breath sounds normal. He has no wheezes. He has no rales.  Abdominal: Soft. There is no tenderness.  Musculoskeletal: Normal range of motion. He  exhibits no edema.  Lymphadenopathy:    He has no cervical adenopathy.  Neurological: He is alert and oriented to person, place, and time. No cranial nerve deficit.  Skin: Skin is warm and dry.     Diagnostic Tests: CT CHEST WITHOUT CONTRAST  TECHNIQUE  Multidetector CT imaging of the chest was performed following the  standard protocol without IV contrast.  COMPARISON  Chest x-ray 02/21/2013.  FINDINGS  Mediastinum: Heart size is normal. There is no significant  pericardial fluid, thickening or pericardial calcification. Numerous  prominent but non pathologically enlarged mediastinal lymph nodes  are conspicuous in number rather than size, and are similar to the  prior examination. No definite pathologically enlarged mediastinal  or hilar lymph nodes. Please note that accurate exclusion of hilar  adenopathy is limited on noncontrast CT scans. Esophagus is  unremarkable in appearance.  Lungs/Pleura: Previously noted right upper lobe pulmonary nodule has  significantly increased in size, currently measuring 9 x 8 mm (image  22 of series 4) with irregular slightly macrolobulated margins.  There is also a new 6 x 4 mm nodule in the left lower lobe (image 53  of series 4), which is nonspecific. 4 mm nodule along the left major  fissure (image 34 of series 4) is unchanged, and likely to represent  a small subpleural lymph node. No acute consolidative airspace  disease. No pleural effusions.  Upper Abdomen: Unremarkable.  Musculoskeletal: There are no aggressive appearing lytic or blastic  lesions noted in the visualized portions of the skeleton.  IMPRESSION  1. Interval increase in size of what is now a 9 x 8 mm right upper  lobe pulmonary nodule. Further evaluation with PET-CT is suggested  at this time.  2. There is also in a new nonspecific 6 x 4 mm pulmonary nodule in  the left lower lobe. Attention on any future followup studies is  recommended to ensure the stability or  resolution of this finding.  3. Additional incidental findings, as above, similar prior studies.  These results will be called to the ordering clinician or  representative by the Radiologist Assistant, and communication  documented in the PACS Dashboard.  SIGNATURE  Electronically Signed  By: Vinnie Langton M.D.  On: 02/26/2014 16:34  NUCLEAR MEDICINE PET SKULL BASE TO THIGH  TECHNIQUE:  9.7 mCi F-18 FDG was injected intravenously. Full-ring PET imaging  was performed from the skull base to thigh after the radiotracer. CT  data was obtained and used for attenuation correction and anatomic  localization.  FASTING BLOOD GLUCOSE: Value: 94 mg/dl  COMPARISON: Chest CT 02/26/2014.  FINDINGS:  NECK  Nonenlarged right scalene and supraclavicular lymph nodes measuring  6 mm in short axis, demonstrate hypermetabolic activity (SUVmax =  2.9-3.6).  CHEST  Previously described 9 x 8 mm right upper lobe nodule (image 23 of  series 8) demonstrates very low-level metabolic activity (SUVmax =  2.1). The other previously described 6 mm nodule in the left lower  lobe has morbid ground-glass attenuation appearance on today's study  (image 58 of series 8), and demonstrates no significant  hypermetabolism (SUVmax = 1.0). However, there are innumerable  predominantly nonenlarged mediastinal and hilar lymph nodes which  demonstrate variable degrees of hypermetabolism (SUVmax = 3.3-8.8),  with the largest of these lymph nodes measuring only 11 mm in short  axis in the subcarinal station (image 74 of series 4), demonstrating  the greatest metabolic activity (SUVmax = 8.8). In addition, there  is a small focus of hypermetabolism in the central aspect of the  left lower lobe which corresponds with CT image 41 of series 8. This  hypermetabolism is posterior and lateral to the left lower lobe  pulmonary artery, and there is no correlate on the CT images to  account for this finding. This is of uncertain  etiology and  significance. There is also a 1.9 x 1.0 cm subcutaneous nodule in  the anterior aspect of the right chest demonstrates low-level  metabolic activity (SUVmax = 2.3).  ABDOMEN/PELVIS  No abnormal hypermetabolic activity within the liver, pancreas,  adrenal glands, or spleen. No hypermetabolic lymph nodes in the  abdomen or pelvis. Numerous calcified gallstones are noted within  the dependent portion of the gallbladder. No evidence of acute  cholecystitis at this time. The unenhanced appearance of the liver,  pancreas, spleen, bilateral adrenal glands and bilateral kidneys is  unremarkable. No significant volume of ascites. No pneumoperitoneum.  No pathologic distention of small bowel. Prostate gland and urinary  bladder are unremarkable in appearance.  SKELETON  No focal hypermetabolic activity to suggest skeletal metastasis.  IMPRESSION:  1. The previously noted enlarging right upper lobe pulmonary nodule  does demonstrate some low-level hyper metabolism. This could be seen  in the setting of an infectious or inflammatory process, or could be  indicative of a small neoplasm.  2. Suprisingly, today's examination demonstrates extensive  hypermetabolic lymphadenopathy throughout the mediastinum, bilateral  hilar regions, and in the right supraclavicular and scalene nodal  stations. The majority of these lymph nodes are not enlarged. This  appearance is highly unusual, but could be seen in the setting of  early lymphoma or other systemic disease such this sarcoidosis.  Nodal biopsy is suggested to establish a tissue diagnosis.  3. In addition, there is a 1.0 x 1.9 cm subcutaneous nodule in the  anterior aspect of the right chest. Correlation with physical  examination is recommended. This could represent a superficial site  of infection, however, a small neoplasm should be excluded.  4. Cholelithiasis without evidence to suggest acute cholecystitis at  this time.   Electronically Signed  By: Vinnie Langton M.D.  On: 03/06/2014 17:35  Pathology report 03/17/2014 Final diagnosis, subcarinal lymph node, endobronchial ultrasound guided biopsy Adequate for evaluation Negative for malignancy Rare epithelial in clusters suggestive of granuloma  Impression: 58 year old male with a bilateral lung nodules (right upper lobe and left lower lobe) and hypermetabolic hilar adenopathy despite relatively normal sized lymph nodes. He is a lifelong nonsmoker.  The differential diagnosis includes infectious etiologies, such as TB or fungal infections, inflammatory processes, e.g. sarcoidosis, and malignancy, including lymphoma or primary bronchogenic carcinoma. I reviewed the films with Mr. and Mrs. Renzulli and we discussed the differential diagnosis at length. The subcarinal nodes had clusters suggestive but not definitive for granulomatous on aspirations done with EBUS.  I discussed 2 possible approaches to this problem.   One  option would be to treat for presumed sarcoidosis based on the EBUS results. This treatment would likely consist of prednisone. If that approach was taken, he would need close followup with another CT in 3 months to make sure that the right upper lobe nodule was not continuing to enlarge. The obvious downside to this approach is that of the right upper lobe nodule is cancerous it would delay definitive treatment. The other downside to this approach is that even if this is all due to granulomatous disease, we do not have enough information based on the EBUS to differentiate infection versus sarcoid.  The second option would be to be more aggressive and definitively determine the etiology for both the adenopathy and the right lung nodule. In all likelihood they are part of the same process. The approach would be mediastinoscopy to biopsy the paratracheal and subcarinal lymph nodes. If biopsies show metastatic cancer we would not proceed further, but  would refer him for chemotherapy and radiation. If the nodes showed granulomas, or any other nonmalignant findings, we would then proceed with a thoracoscopic right wedge resection and possible lobectomy if the nodule turned out to be cancerous.   I discussed with them the general nature of the operation, including the need for general anesthesia, incisions to be used, expected hospital stay, and overall recovery. He understands the risks of surgery include, but are not limited to death, MI, DVT, PE, stroke, pneumothorax, bleeding, possible need for transfusion, infection, prolonged air leaks, as well as the possibility of other unforeseeable complications. He does understand the intraoperative decision making that would take place.  While I do not think either of these options is unreasonable, personnally I would favor a more aggressive approach.  After extensive discussion Mr. Bacha indicates that he would like to proceed with mediastinoscopy and right thoracoscopic wedge resection, possible lobectomy.  Plan: Mediastinoscopy, right VATS, wedge resection, possible right upper lobectomy on Monday, April 27

## 2014-03-31 ENCOUNTER — Encounter (HOSPITAL_COMMUNITY): Payer: Self-pay

## 2014-04-01 ENCOUNTER — Other Ambulatory Visit: Payer: Self-pay | Admitting: *Deleted

## 2014-04-01 ENCOUNTER — Other Ambulatory Visit: Payer: Self-pay | Admitting: Internal Medicine

## 2014-04-01 DIAGNOSIS — R911 Solitary pulmonary nodule: Secondary | ICD-10-CM

## 2014-04-10 ENCOUNTER — Inpatient Hospital Stay (HOSPITAL_COMMUNITY): Admission: RE | Admit: 2014-04-10 | Payer: 59 | Source: Ambulatory Visit

## 2014-04-14 ENCOUNTER — Inpatient Hospital Stay (HOSPITAL_COMMUNITY)
Admission: RE | Admit: 2014-04-14 | Payer: 59 | Source: Ambulatory Visit | Admitting: Thoracic Surgery (Cardiothoracic Vascular Surgery)

## 2014-04-14 ENCOUNTER — Encounter (HOSPITAL_COMMUNITY): Admission: RE | Payer: Self-pay | Source: Ambulatory Visit

## 2014-04-14 SURGERY — MEDIASTINOSCOPY
Anesthesia: General | Laterality: Right

## 2014-07-21 ENCOUNTER — Ambulatory Visit
Admission: RE | Admit: 2014-07-21 | Discharge: 2014-07-21 | Disposition: A | Discharge status: Home / Self Care | Payer: 59 | Source: Ambulatory Visit | Attending: Internal Medicine | Admitting: Internal Medicine

## 2014-07-21 DIAGNOSIS — R911 Solitary pulmonary nodule: Secondary | ICD-10-CM

## 2014-12-19 HISTORY — PX: BRONCHOSCOPY: SUR163

## 2015-03-16 ENCOUNTER — Other Ambulatory Visit: Payer: Self-pay | Admitting: Internal Medicine

## 2015-03-16 DIAGNOSIS — R911 Solitary pulmonary nodule: Secondary | ICD-10-CM

## 2015-03-19 ENCOUNTER — Other Ambulatory Visit: Payer: Self-pay

## 2015-03-30 ENCOUNTER — Ambulatory Visit
Admission: RE | Admit: 2015-03-30 | Discharge: 2015-03-30 | Disposition: A | Discharge status: Home / Self Care | Payer: 59 | Source: Ambulatory Visit | Attending: Internal Medicine | Admitting: Internal Medicine

## 2015-03-30 DIAGNOSIS — R911 Solitary pulmonary nodule: Secondary | ICD-10-CM

## 2015-12-09 ENCOUNTER — Encounter (HOSPITAL_COMMUNITY)
Admission: RE | Admit: 2015-12-09 | Discharge: 2015-12-09 | Disposition: A | Discharge status: Home / Self Care | Payer: 59 | Source: Ambulatory Visit | Attending: Orthopedic Surgery | Admitting: Orthopedic Surgery

## 2015-12-09 ENCOUNTER — Encounter (HOSPITAL_COMMUNITY): Payer: Self-pay

## 2015-12-09 DIAGNOSIS — Z01818 Encounter for other preprocedural examination: Secondary | ICD-10-CM | POA: Diagnosis not present

## 2015-12-09 DIAGNOSIS — Z01812 Encounter for preprocedural laboratory examination: Secondary | ICD-10-CM | POA: Diagnosis not present

## 2015-12-09 DIAGNOSIS — Z0183 Encounter for blood typing: Secondary | ICD-10-CM | POA: Insufficient documentation

## 2015-12-09 DIAGNOSIS — I498 Other specified cardiac arrhythmias: Secondary | ICD-10-CM | POA: Diagnosis not present

## 2015-12-09 DIAGNOSIS — M1711 Unilateral primary osteoarthritis, right knee: Secondary | ICD-10-CM | POA: Diagnosis not present

## 2015-12-09 HISTORY — DX: Calculus of gallbladder without cholecystitis without obstruction: K80.20

## 2015-12-09 HISTORY — DX: Concussion with loss of consciousness status unknown, initial encounter: S06.0XAA

## 2015-12-09 HISTORY — DX: Unspecified staphylococcus as the cause of diseases classified elsewhere: B95.8

## 2015-12-09 HISTORY — DX: Paresthesia of skin: R20.0

## 2015-12-09 HISTORY — DX: Concussion with loss of consciousness of unspecified duration, initial encounter: S06.0X9A

## 2015-12-09 HISTORY — DX: Foot drop, right foot: M21.371

## 2015-12-09 HISTORY — DX: Dizziness and giddiness: R42

## 2015-12-09 HISTORY — DX: Gastro-esophageal reflux disease without esophagitis: K21.9

## 2015-12-09 HISTORY — DX: Unspecified osteoarthritis, unspecified site: M19.90

## 2015-12-09 HISTORY — DX: Pneumonia, unspecified organism: J18.9

## 2015-12-09 HISTORY — DX: Paresthesia of skin: R20.2

## 2015-12-09 HISTORY — DX: Tinnitus, unspecified ear: H93.19

## 2015-12-09 LAB — CBC
HCT: 42.2 % (ref 39.0–52.0)
HEMOGLOBIN: 15.1 g/dL (ref 13.0–17.0)
MCH: 30.5 pg (ref 26.0–34.0)
MCHC: 35.8 g/dL (ref 30.0–36.0)
MCV: 85.3 fL (ref 78.0–100.0)
PLATELETS: 188 10*3/uL (ref 150–400)
RBC: 4.95 MIL/uL (ref 4.22–5.81)
RDW: 12.8 % (ref 11.5–15.5)
WBC: 13.1 10*3/uL — AB (ref 4.0–10.5)

## 2015-12-09 LAB — BASIC METABOLIC PANEL
ANION GAP: 9 (ref 5–15)
BUN: 15 mg/dL (ref 6–20)
CHLORIDE: 103 mmol/L (ref 101–111)
CO2: 28 mmol/L (ref 22–32)
Calcium: 9.4 mg/dL (ref 8.9–10.3)
Creatinine, Ser: 1.07 mg/dL (ref 0.61–1.24)
GFR calc non Af Amer: >60 mL/min (ref >60)
Glucose, Bld: 95 mg/dL (ref 65–99)
POTASSIUM: 4.5 mmol/L (ref 3.5–5.1)
SODIUM: 140 mmol/L (ref 135–145)

## 2015-12-09 LAB — URINALYSIS, ROUTINE W REFLEX MICROSCOPIC
Bilirubin Urine: NEGATIVE
GLUCOSE, UA: NEGATIVE mg/dL
Hgb urine dipstick: NEGATIVE
Ketones, ur: NEGATIVE mg/dL
LEUKOCYTES UA: NEGATIVE
NITRITE: NEGATIVE
PH: 5.5 (ref 5.0–8.0)
PROTEIN: NEGATIVE mg/dL
Specific Gravity, Urine: 1.011 (ref 1.005–1.030)

## 2015-12-09 LAB — SURGICAL PCR SCREEN
MRSA, PCR: NEGATIVE
STAPHYLOCOCCUS AUREUS: NEGATIVE

## 2015-12-09 LAB — PROTIME-INR
INR: 1.1 (ref 0.00–1.49)
Prothrombin Time: 14.4 seconds (ref 11.6–15.2)

## 2015-12-09 LAB — APTT: APTT: 28 s (ref 24–37)

## 2015-12-09 NOTE — Patient Instructions (Signed)
Jeffrey Adkins  12/09/2015   Your procedure is scheduled on: Friday December 18, 2015  Report to [facility]  Entrance take [place]  elevators to 3rd floor to  [place] at 7:30 AM.  Call this number if you have problems the morning of surgery [phone]   Remember: ONLY 1 PERSON MAY GO WITH YOU TO SHORT STAY TO GET  READY MORNING OF Jeffrey Adkins.  Do not eat food or drink liquids :After Midnight.     Take these medicines the morning of surgery with A SIP OF WATER: Claritin if needed                               You may not have any metal on your body including hair pins and              piercings  Do not wear jewelry, lotions, powders or colognes, deodorant                           Men may shave face and neck.   Do not bring valuables to the hospital. [place].  Contacts, dentures or bridgework may not be worn into surgery.  Leave suitcase in the car. After surgery it may be brought to your room.                Please read over the following fact sheets you were given:[facility] INFORMATION SHEET; INCENTIVE SPIROMETER; BLOOD TRANSFUSION INFORMATION SHEET  _____________________________________________________________________             [facility] - Preparing for Surgery Before surgery, you can play an important role.  Because skin is not sterile, your skin needs to be as free of germs as possible.  You can reduce the number of germs on your skin by washing with CHG (chlorahexidine gluconate) soap before surgery.  CHG is an antiseptic cleaner which kills germs and bonds with the skin to continue killing germs even after washing. Please DO NOT use if you have an allergy to CHG or antibacterial soaps.  If your skin becomes reddened/irritated stop using the CHG and inform your nurse when you arrive at Short Stay. Do not shave (including legs and underarms) for at least 48 hours prior to the first CHG  shower.  You may shave your face/neck. Please follow these instructions carefully:  1.  Shower with CHG Soap the night before surgery and the  morning of Surgery.  2.  If you choose to wash your hair, wash your hair first as usual with your  normal  shampoo.  3.  After you shampoo, rinse your hair and body thoroughly to remove the  shampoo.                           4.  Use CHG as you would any other liquid soap.  You can apply chg directly  to the skin and wash                       Gently with a scrungie or clean washcloth.  5.  Apply the CHG Soap to your body ONLY  FROM THE NECK DOWN.   Do not use on face/ open                           Wound or open sores. Avoid contact with eyes, ears mouth and genitals (private parts).                       Wash face,  Genitals (private parts) with your normal soap.             6.  Wash thoroughly, paying special attention to the area where your surgery  will be performed.  7.  Thoroughly rinse your body with warm water from the neck down.  8.  DO NOT shower/wash with your normal soap after using and rinsing off  the CHG Soap.                9.  Pat yourself dry with a clean towel.            10.  Wear clean pajamas.            11.  Place clean sheets on your bed the night of your first shower and do not  sleep with pets. Day of Surgery : Do not apply any lotions/deodorants the morning of surgery.  Please wear clean clothes to the hospital/surgery center.  FAILURE TO FOLLOW THESE INSTRUCTIONS MAY RESULT IN THE CANCELLATION OF YOUR SURGERY PATIENT SIGNATURE_________________________________  NURSE SIGNATURE__________________________________  ________________________________________________________________________   Jeffrey Adkins  An incentive spirometer is a tool that can help keep your lungs clear and active. This tool measures how well you are filling your lungs with each breath. Taking long deep breaths may help reverse or decrease the chance  of developing breathing (pulmonary) problems (especially infection) following:  A long period of time when you are unable to move or be active. BEFORE THE PROCEDURE   If the spirometer includes an indicator to show your best effort, your nurse or respiratory therapist will set it to a desired goal.  If possible, sit up straight or lean slightly forward. Try not to slouch.  Hold the incentive spirometer in an upright position. INSTRUCTIONS FOR USE   Sit on the edge of your bed if possible, or sit up as far as you can in bed or on a chair.  Hold the incentive spirometer in an upright position.  Breathe out normally.  Place the mouthpiece in your mouth and seal your lips tightly around it.  Breathe in slowly and as deeply as possible, raising the piston or the ball toward the top of the column.  Hold your breath for 3-5 seconds or for as long as possible. Allow the piston or ball to fall to the bottom of the column.  Remove the mouthpiece from your mouth and breathe out normally.  Rest for a few seconds and repeat Steps 1 through 7 at least 10 times every 1-2 hours when you are awake. Take your time and take a few normal breaths between deep breaths.  The spirometer may include an indicator to show your best effort. Use the indicator as a goal to work toward during each repetition.  After each set of 10 deep breaths, practice coughing to be sure your lungs are clear. If you have an incision (the cut made at the time of surgery), support your incision when coughing by placing a pillow or rolled up towels firmly against it. Once you  are able to get out of bed, walk around indoors and cough well. You may stop using the incentive spirometer when instructed by your caregiver.  RISKS AND COMPLICATIONS  Take your time so you do not get dizzy or light-headed.  If you are in pain, you may need to take or ask for pain medication before doing incentive spirometry. It is harder to take a deep  breath if you are having pain. AFTER USE  Rest and breathe slowly and easily.  It can be helpful to keep track of a log of your progress. Your caregiver can provide you with a simple table to help with this. If you are using the spirometer at home, follow these instructions: Jeffrey Adkins IF:   You are having difficultly using the spirometer.  You have trouble using the spirometer as often as instructed.  Your pain medication is not giving enough relief while using the spirometer.  You develop fever of 100.5 F (38.1 C) or higher. SEEK IMMEDIATE MEDICAL CARE IF:   You cough up bloody sputum that had not been present before.  You develop fever of 102 F (38.9 C) or greater.  You develop worsening pain at or near the incision site. MAKE SURE YOU:   Understand these instructions.  Will watch your condition.  Will get help right away if you are not doing well or get worse. Document Released: 04/17/2007 Document Revised: 02/27/2012 Document Reviewed: 06/18/2007 Exit[employer] Patient Information 2014 [employer], [place].   ________________________________________________________________________  WHAT IS A BLOOD TRANSFUSION? Blood Transfusion Information  A transfusion is the replacement of blood or some of its parts. Blood is made up of multiple cells which provide different functions.  Red blood cells carry oxygen and are used for blood loss replacement.  White blood cells fight against infection.  Platelets control bleeding.  Plasma helps clot blood.  Other blood products are available for specialized needs, such as hemophilia or other clotting disorders. BEFORE THE TRANSFUSION  Who gives blood for transfusions?   Healthy volunteers who are fully evaluated to make sure their blood is safe. This is blood bank blood. Transfusion therapy is the safest it has ever been in the practice of medicine. Before blood is taken from a donor, a complete history is taken to make sure  that person has no history of diseases nor engages in risky social behavior (examples are intravenous drug use or sexual activity with multiple partners). The donor's travel history is screened to minimize risk of transmitting infections, such as malaria. The donated blood is tested for signs of infectious diseases, such as HIV and hepatitis. The blood is then tested to be sure it is compatible with you in order to minimize the chance of a transfusion reaction. If you or a relative donates blood, this is often done in anticipation of surgery and is not appropriate for emergency situations. It takes many days to process the donated blood. RISKS AND COMPLICATIONS Although transfusion therapy is very safe and saves many lives, the main dangers of transfusion include:   Getting an infectious disease.  Developing a transfusion reaction. This is an allergic reaction to something in the blood you were given. Every precaution is taken to prevent this. The decision to have a blood transfusion has been considered carefully by your caregiver before blood is given. Blood is not given unless the benefits outweigh the risks. AFTER THE TRANSFUSION  Right after receiving a blood transfusion, you will usually feel much better and more energetic. This is  especially true if your red blood cells have gotten low (anemic). The transfusion raises the level of the red blood cells which carry oxygen, and this usually causes an energy increase.  The nurse administering the transfusion will monitor you carefully for complications. HOME CARE INSTRUCTIONS  No special instructions are needed after a transfusion. You may find your energy is better. Speak with your caregiver about any limitations on activity for underlying diseases you may have. SEEK MEDICAL CARE IF:   Your condition is not improving after your transfusion.  You develop redness or irritation at the intravenous (IV) site. SEEK IMMEDIATE MEDICAL CARE IF:  Any of  the following symptoms occur over the next 12 hours:  Shaking chills.  You have a temperature by mouth above 102 F (38.9 C), not controlled by medicine.  Chest, back, or muscle pain.  People around you feel you are not acting correctly or are confused.  Shortness of breath or difficulty breathing.  Dizziness and fainting.  You get a rash or develop hives.  You have a decrease in urine output.  Your urine turns a dark color or changes to pink, red, or brown. Any of the following symptoms occur over the next 10 days:  You have a temperature by mouth above 102 F (38.9 C), not controlled by medicine.  Shortness of breath.  Weakness after normal activity.  The white part of the eye turns yellow (jaundice).  You have a decrease in the amount of urine or are urinating less often.  Your urine turns a dark color or changes to pink, red, or brown. Document Released: 12/02/2000 Document Revised: 02/27/2012 Document Reviewed: 07/21/2008 [facility] Patient Information 2014 [place], [place].  _______________________________________________________________________

## 2015-12-09 NOTE — Consult Note (Signed)
Anesthesiology Pre-op Note:  59 year old male who is extremely active and does cross fit competitions now scheduled for R. TKR on 12/30. He has a history of mediastinal adenopathy and R. Upper lobe mass which have been asymptomatic. Previous bronchoscopy has been nondiagnostic, and he has refused a VATs. CTs have shown no enlargement of LNs or mass. He denies SOB, fevers, night sweats or weight loss.  I Believe he is stable to undergo planned knee replacement on 12/30. He states he prefers spinal anesthesia.  Roberts Gaudy

## 2015-12-09 NOTE — Progress Notes (Signed)
Clearance note per Dr Inda Merlin per chart

## 2015-12-09 NOTE — Progress Notes (Signed)
Dr Joslin/Anesthesia in to see pt.

## 2015-12-10 LAB — ABO/RH: ABO/RH(D): A POS

## 2015-12-16 NOTE — H&P (Signed)
TOTAL KNEE ADMISSION H&P  Patient is being admitted for right total knee arthroplasty.  Subjective:  Chief Complaint:    Right knee primary OA / pain  HPI: Jeffrey Adkins, 59 y.o. male, has a history of pain and functional disability in the right knee due to arthritis and has failed non-surgical conservative treatments for greater than 12 weeks to include NSAID's and/or analgesics, corticosteriod injections and activity modification.  Onset of symptoms was gradual, starting >10 years ago with gradually worsening course since that time. The patient noted prior procedures on the knee to include  arthroscopy on the right knee(s).  Patient currently rates pain in the right knee(s) at 10 out of 10 with activity. Patient has worsening of pain with activity and weight bearing, pain that interferes with activities of daily living, pain with passive range of motion, crepitus and joint swelling.  Patient has evidence of periarticular osteophytes and joint space narrowing by imaging studies. There is no active infection.   Risks, benefits and expectations were discussed with the patient.  Risks including but not limited to the risk of anesthesia, blood clots, nerve damage, blood vessel damage, failure of the prosthesis, infection and up to and including death.  Patient understand the risks, benefits and expectations and wishes to proceed with surgery.   PCP: [name], MD  D/C Plans:      Home with HHPT  Post-op Meds:       No Rx given  Tranexamic Acid:      To be given - topically  (previous PE)  Decadron:      Is to be given  FYI:     Xarelto then ASA  Tramadol and APAP    Patient Active Problem List   Diagnosis Date Noted  . Pulmonary embolism ([place]) 02/21/2013  . Pulmonary nodule 02/21/2013  . Mediastinal lymphadenopathy 02/21/2013   Past Medical History  Diagnosis Date  . Pulmonary embolism ([place])     right lung  . Childhood asthma   . Allergic rhinitis   . Solitary pulmonary  nodule 02/21/13    CT-PA performed 02/21/13. Enlarging on repeat CT scan 02/26/14. PET performed 02/28/14.   Marland Kitchen Foot drop, right   . Numbness and tingling     right leg   . Tinnitus   . Closed head injury with concussion ([place])     multiple times secondary to sports/rock climbing  . Vertigo   . Pneumonia     last episode 4 to 5 years ago   . Gallstones   . GERD (gastroesophageal reflux disease)   . Arthritis   . Staph infection     right knee     Past Surgical History  Procedure Laterality Date  . Back surgery    . Knee surgery      right knee scoped 1992; I&D 6 times;   . Ankle surgery    . Amputation great right toe     . Picc line place peripheral ([facility] hx)      history of in 1992 secondary to staph infection   . Left ring finger       secondary to tendon tear   . Eye surgery      LASIK 1996     No prescriptions prior to admission   Allergies  Allergen Reactions  . Peanut-Containing Drug Products Anaphylaxis    All nuts  . Hydrocodone Hives  . Madelaine Bhat Isothiocyanate]     other  . Eggs Or Egg-Derived Products Rash  Social History  Substance Use Topics  . Smoking status: Never Smoker   . Smokeless tobacco: Former Systems developer    Types: [place] date: 12/19/1974  . Alcohol Use: No    Family History  Problem Relation Age of Onset  . Hypertension Mother   . Sjogren's syndrome Mother   . Celiac disease Sister      Review of Systems  Constitutional: Negative.   HENT: Positive for tinnitus.   Eyes: Negative.   Respiratory: Negative.   Cardiovascular: Negative.   Gastrointestinal: Positive for heartburn.  Genitourinary: Negative.   Musculoskeletal: Positive for back pain and joint pain.  Skin: Negative.   Neurological: Negative.   Endo/Heme/Allergies: Positive for environmental allergies.  Psychiatric/Behavioral: Negative.     Objective:  Physical Exam  Constitutional: He is oriented to person, place, and time. He appears well-developed.  HENT:   Head: Normocephalic.  Eyes: Pupils are equal, round, and reactive to light.  Neck: Neck supple. No JVD present. No tracheal deviation present. No thyromegaly present.  Cardiovascular: Normal rate, regular rhythm, normal heart sounds and intact distal pulses.   Respiratory: Effort normal and breath sounds normal. No stridor. No respiratory distress. He has no wheezes.  GI: Soft. There is no tenderness. There is no guarding.  Musculoskeletal:       Right knee: He exhibits decreased range of motion, swelling and bony tenderness. He exhibits no ecchymosis, no deformity, no laceration and no erythema. Tenderness found.  Lymphadenopathy:    He has no cervical adenopathy.  Neurological: He is alert and oriented to person, place, and time. A sensory deficit (drop foot on the right) is present.  Skin: Skin is warm and dry.  Psychiatric: He has a normal mood and affect.      Labs:  Estimated body mass index is 24.56 kg/(m^2) as calculated from the following:   Height as of 03/25/14: 5\' 11"  (1.803 m).   Weight as of 03/25/14: 79.833 kg (176 lb).   Imaging Review Plain radiographs demonstrate severe degenerative joint disease of the right knee(s). The overall alignment is neutral. The bone quality appears to be good for age and reported activity level.  Assessment/Plan:  End stage arthritis, right knee   The patient history, physical examination, clinical judgment of the provider and imaging studies are consistent with end stage degenerative joint disease of the right knee(s) and total knee arthroplasty is deemed medically necessary. The treatment options including medical management, injection therapy arthroscopy and arthroplasty were discussed at length. The risks and benefits of total knee arthroplasty were presented and reviewed. The risks due to aseptic loosening, infection, stiffness, patella tracking problems, thromboembolic complications and other imponderables were discussed. The patient  acknowledged the explanation, agreed to proceed with the plan and consent was signed. Patient is being admitted for inpatient treatment for surgery, pain control, PT, OT, prophylactic antibiotics, VTE prophylaxis, progressive ambulation and ADL's and discharge planning. The patient is planning to be discharged home with home health services.      [place] Jeneva Schweizer   PA-C  12/16/2015, 10:18 PM

## 2015-12-18 ENCOUNTER — Inpatient Hospital Stay (HOSPITAL_COMMUNITY): Payer: 59 | Admitting: Anesthesiology

## 2015-12-18 ENCOUNTER — Ambulatory Visit (HOSPITAL_COMMUNITY)
Admission: RE | Admit: 2015-12-18 | Discharge: 2015-12-18 | Disposition: A | Discharge status: Home / Self Care | Payer: 59 | Source: Ambulatory Visit | Attending: Orthopedic Surgery | Admitting: Orthopedic Surgery

## 2015-12-18 ENCOUNTER — Encounter (HOSPITAL_COMMUNITY)
Admission: RE | Disposition: A | Discharge status: Home / Self Care | Payer: Self-pay | Source: Ambulatory Visit | Attending: Orthopedic Surgery

## 2015-12-18 ENCOUNTER — Encounter (HOSPITAL_COMMUNITY): Payer: Self-pay

## 2015-12-18 DIAGNOSIS — K219 Gastro-esophageal reflux disease without esophagitis: Secondary | ICD-10-CM | POA: Insufficient documentation

## 2015-12-18 DIAGNOSIS — M1711 Unilateral primary osteoarthritis, right knee: Principal | ICD-10-CM | POA: Insufficient documentation

## 2015-12-18 DIAGNOSIS — Z91012 Allergy to eggs: Secondary | ICD-10-CM | POA: Diagnosis not present

## 2015-12-18 DIAGNOSIS — Z91018 Allergy to other foods: Secondary | ICD-10-CM | POA: Diagnosis not present

## 2015-12-18 DIAGNOSIS — Z885 Allergy status to narcotic agent status: Secondary | ICD-10-CM | POA: Diagnosis not present

## 2015-12-18 DIAGNOSIS — Z86711 Personal history of pulmonary embolism: Secondary | ICD-10-CM | POA: Insufficient documentation

## 2015-12-18 DIAGNOSIS — Z96659 Presence of unspecified artificial knee joint: Secondary | ICD-10-CM

## 2015-12-18 DIAGNOSIS — Z87891 Personal history of nicotine dependence: Secondary | ICD-10-CM | POA: Insufficient documentation

## 2015-12-18 DIAGNOSIS — Z91010 Allergy to peanuts: Secondary | ICD-10-CM | POA: Insufficient documentation

## 2015-12-18 DIAGNOSIS — Z96651 Presence of right artificial knee joint: Secondary | ICD-10-CM

## 2015-12-18 HISTORY — PX: TOTAL KNEE ARTHROPLASTY: SHX125

## 2015-12-18 LAB — TYPE AND SCREEN
ABO/RH(D): A POS
ANTIBODY SCREEN: NEGATIVE

## 2015-12-18 SURGERY — ARTHROPLASTY, KNEE, TOTAL
Anesthesia: Spinal | Site: Knee | Laterality: Right

## 2015-12-18 MED ORDER — MIDAZOLAM HCL 2 MG/2ML IJ SOLN
INTRAMUSCULAR | Status: AC
  Filled 2015-12-18: qty 2

## 2015-12-18 MED ORDER — CHLORHEXIDINE GLUCONATE 4 % EX LIQD: 60.0000 mL | Freq: Once | CUTANEOUS | Status: DC

## 2015-12-18 MED ORDER — FENTANYL CITRATE (PF) 100 MCG/2ML IJ SOLN
INTRAMUSCULAR | Status: AC
  Filled 2015-12-18: qty 2

## 2015-12-18 MED ORDER — BUPIVACAINE-EPINEPHRINE (PF) 0.25% -1:200000 IJ SOLN
INTRAMUSCULAR | Status: AC
  Filled 2015-12-18: qty 30

## 2015-12-18 MED ORDER — FENTANYL CITRATE (PF) 100 MCG/2ML IJ SOLN
INTRAMUSCULAR | Status: DC | PRN
  Administered 2015-12-18 (×2): 50 ug via INTRAVENOUS

## 2015-12-18 MED ORDER — EPHEDRINE SULFATE 50 MG/ML IJ SOLN
INTRAMUSCULAR | Status: AC
  Filled 2015-12-18: qty 1

## 2015-12-18 MED ORDER — ASPIRIN EC 325 MG PO TBEC: 325.0000 mg | DELAYED_RELEASE_TABLET | Freq: Two times a day (BID) | ORAL | Status: AC

## 2015-12-18 MED ORDER — TRANEXAMIC ACID 1000 MG/10ML IV SOLN
2000.0000 mg | Freq: Once | INTRAVENOUS | Status: DC
  Filled 2015-12-18: qty 20

## 2015-12-18 MED ORDER — METHOCARBAMOL 500 MG PO TABS: 500.0000 mg | ORAL_TABLET | Freq: Four times a day (QID) | ORAL | Status: DC | PRN

## 2015-12-18 MED ORDER — PROPOFOL 10 MG/ML IV BOLUS
INTRAVENOUS | Status: AC
  Filled 2015-12-18: qty 20

## 2015-12-18 MED ORDER — SODIUM CHLORIDE 0.9 % IJ SOLN
INTRAMUSCULAR | Status: DC | PRN
  Administered 2015-12-18: 30 mL

## 2015-12-18 MED ORDER — OXYCODONE-ACETAMINOPHEN 5-325 MG PO TABS: 1.0000 | ORAL_TABLET | ORAL | Status: DC | PRN

## 2015-12-18 MED ORDER — FENTANYL CITRATE (PF) 100 MCG/2ML IJ SOLN: 25.0000 ug | INTRAMUSCULAR | Status: DC | PRN

## 2015-12-18 MED ORDER — KETOROLAC TROMETHAMINE 30 MG/ML IJ SOLN
INTRAMUSCULAR | Status: AC
  Filled 2015-12-18: qty 1

## 2015-12-18 MED ORDER — SODIUM CHLORIDE 0.9 % IJ SOLN
INTRAMUSCULAR | Status: AC
  Filled 2015-12-18: qty 50

## 2015-12-18 MED ORDER — RIVAROXABAN 10 MG PO TABS: 10.0000 mg | ORAL_TABLET | Freq: Every day | ORAL | Status: DC

## 2015-12-18 MED ORDER — MEPERIDINE HCL 50 MG/ML IJ SOLN: 6.2500 mg | INTRAMUSCULAR | Status: DC | PRN

## 2015-12-18 MED ORDER — VANCOMYCIN HCL IN DEXTROSE 1-5 GM/200ML-% IV SOLN
1000.0000 mg | INTRAVENOUS | Status: AC
  Administered 2015-12-18: 1000 mg via INTRAVENOUS
  Filled 2015-12-18: qty 200

## 2015-12-18 MED ORDER — DEXAMETHASONE SODIUM PHOSPHATE 10 MG/ML IJ SOLN
INTRAMUSCULAR | Status: AC
  Filled 2015-12-18: qty 1

## 2015-12-18 MED ORDER — PROPOFOL 10 MG/ML IV BOLUS
INTRAVENOUS | Status: AC
  Filled 2015-12-18: qty 60

## 2015-12-18 MED ORDER — MIDAZOLAM HCL 5 MG/5ML IJ SOLN
INTRAMUSCULAR | Status: DC | PRN
  Administered 2015-12-18: 2 mg via INTRAVENOUS

## 2015-12-18 MED ORDER — METHOCARBAMOL 1000 MG/10ML IJ SOLN
500.0000 mg | Freq: Four times a day (QID) | INTRAVENOUS | Status: DC | PRN
  Administered 2015-12-18: 500 mg via INTRAVENOUS
  Filled 2015-12-18 (×2): qty 5

## 2015-12-18 MED ORDER — TRANEXAMIC ACID 1000 MG/10ML IV SOLN
2000.0000 mg | INTRAVENOUS | Status: DC | PRN
  Administered 2015-12-18: 2000 mg via TOPICAL

## 2015-12-18 MED ORDER — KETOROLAC TROMETHAMINE 30 MG/ML IJ SOLN
INTRAMUSCULAR | Status: DC | PRN
  Administered 2015-12-18: 30 mg

## 2015-12-18 MED ORDER — HYDROMORPHONE HCL 1 MG/ML IJ SOLN: 0.5000 mg | INTRAMUSCULAR | Status: DC | PRN

## 2015-12-18 MED ORDER — DEXAMETHASONE SODIUM PHOSPHATE 10 MG/ML IJ SOLN
10.0000 mg | Freq: Once | INTRAMUSCULAR | Status: AC
  Administered 2015-12-18: 10 mg via INTRAVENOUS

## 2015-12-18 MED ORDER — CEPHALEXIN 500 MG PO CAPS: 500.0000 mg | ORAL_CAPSULE | Freq: Three times a day (TID) | ORAL | Status: DC

## 2015-12-18 MED ORDER — SODIUM CHLORIDE 0.9 % IJ SOLN
INTRAMUSCULAR | Status: AC
  Filled 2015-12-18: qty 10

## 2015-12-18 MED ORDER — ONDANSETRON HCL 4 MG/2ML IJ SOLN
INTRAMUSCULAR | Status: AC
  Filled 2015-12-18: qty 2

## 2015-12-18 MED ORDER — CEFAZOLIN SODIUM-DEXTROSE 2-3 GM-% IV SOLR
INTRAVENOUS | Status: DC | PRN
  Administered 2015-12-18: 2 g via INTRAVENOUS

## 2015-12-18 MED ORDER — BUPIVACAINE-EPINEPHRINE (PF) 0.25% -1:200000 IJ SOLN
INTRAMUSCULAR | Status: DC | PRN
  Administered 2015-12-18: 30 mL

## 2015-12-18 MED ORDER — LACTATED RINGERS IV SOLN
INTRAVENOUS | Status: DC
  Administered 2015-12-18: 13:00:00 via INTRAVENOUS

## 2015-12-18 MED ORDER — PROPOFOL 10 MG/ML IV BOLUS
INTRAVENOUS | Status: DC | PRN
  Administered 2015-12-18: 50 mg via INTRAVENOUS

## 2015-12-18 MED ORDER — CELECOXIB 200 MG PO CAPS
200.0000 mg | ORAL_CAPSULE | Freq: Two times a day (BID) | ORAL | Status: DC
Start: 2015-12-18 — End: 2016-04-07

## 2015-12-18 MED ORDER — PROPOFOL 500 MG/50ML IV EMUL
INTRAVENOUS | Status: DC | PRN
  Administered 2015-12-18: 100 ug/kg/min via INTRAVENOUS

## 2015-12-18 MED ORDER — EPHEDRINE SULFATE 50 MG/ML IJ SOLN
INTRAMUSCULAR | Status: DC | PRN
  Administered 2015-12-18: 5 mg via INTRAVENOUS

## 2015-12-18 MED ORDER — ONDANSETRON HCL 4 MG/2ML IJ SOLN
INTRAMUSCULAR | Status: DC | PRN
  Administered 2015-12-18: 4 mg via INTRAVENOUS

## 2015-12-18 MED ORDER — PROMETHAZINE HCL 25 MG/ML IJ SOLN: 6.2500 mg | INTRAMUSCULAR | Status: DC | PRN

## 2015-12-18 MED ORDER — CEFAZOLIN SODIUM-DEXTROSE 2-3 GM-% IV SOLR
INTRAVENOUS | Status: AC
  Filled 2015-12-18: qty 50

## 2015-12-18 MED ORDER — LACTATED RINGERS IV SOLN
INTRAVENOUS | Status: DC
Start: 2015-12-18 — End: 2015-12-18
  Administered 2015-12-18 (×2): via INTRAVENOUS

## 2015-12-18 MED ORDER — BUPIVACAINE IN DEXTROSE 0.75-8.25 % IT SOLN
INTRATHECAL | Status: DC | PRN
  Administered 2015-12-18: 2 mL via INTRATHECAL

## 2015-12-18 SURGICAL SUPPLY — 46 items
BAG DECANTER FOR FLEXI CONT (MISCELLANEOUS) IMPLANT
BAG ZIPLOCK 12X15 (MISCELLANEOUS) IMPLANT
BANDAGE ACE 6X5 VEL STRL LF (GAUZE/BANDAGES/DRESSINGS) IMPLANT
BANDAGE ELASTIC 6 VELCRO ST LF (GAUZE/BANDAGES/DRESSINGS) ×3 IMPLANT
BLADE SAW SGTL 13.0X1.19X90.0M (BLADE) ×3 IMPLANT
BOWL SMART MIX CTS (DISPOSABLE) ×3 IMPLANT
CAPT KNEE TOTAL 3 ATTUNE ×3 IMPLANT
CEMENT HV SMART SET (Cement) ×6 IMPLANT
CLOTH BEACON ORANGE TIMEOUT ST (SAFETY) ×3 IMPLANT
CUFF TOURN SGL QUICK 34 (TOURNIQUET CUFF) ×2
CUFF TRNQT CYL 34X4X40X1 (TOURNIQUET CUFF) ×1 IMPLANT
DECANTER SPIKE VIAL GLASS SM (MISCELLANEOUS) ×3 IMPLANT
DRAPE U-SHAPE 47X51 STRL (DRAPES) ×3 IMPLANT
DRSG AQUACEL AG ADV 3.5X10 (GAUZE/BANDAGES/DRESSINGS) ×3 IMPLANT
DURAPREP 26ML APPLICATOR (WOUND CARE) ×6 IMPLANT
ELECT REM PT RETURN 9FT ADLT (ELECTROSURGICAL) ×3
ELECTRODE REM PT RTRN 9FT ADLT (ELECTROSURGICAL) ×1 IMPLANT
GLOVE BIOGEL M 7.0 STRL (GLOVE) IMPLANT
GLOVE BIOGEL PI IND STRL 7.5 (GLOVE) ×1 IMPLANT
GLOVE BIOGEL PI IND STRL 8.5 (GLOVE) ×1 IMPLANT
GLOVE BIOGEL PI INDICATOR 7.5 (GLOVE) ×2
GLOVE BIOGEL PI INDICATOR 8.5 (GLOVE) ×2
GLOVE ECLIPSE 8.0 STRL XLNG CF (GLOVE) ×3 IMPLANT
GLOVE ORTHO TXT STRL SZ7.5 (GLOVE) ×6 IMPLANT
GOWN STRL REUS W/TWL LRG LVL3 (GOWN DISPOSABLE) ×3 IMPLANT
GOWN STRL REUS W/TWL XL LVL3 (GOWN DISPOSABLE) ×3 IMPLANT
HANDPIECE INTERPULSE COAX TIP (DISPOSABLE) ×2
IMMOBILIZER KNEE 20 (SOFTGOODS) ×3 IMPLANT
LIQUID BAND (GAUZE/BANDAGES/DRESSINGS) ×3 IMPLANT
MANIFOLD NEPTUNE II (INSTRUMENTS) ×3 IMPLANT
PACK TOTAL KNEE CUSTOM (KITS) ×3 IMPLANT
POSITIONER SURGICAL ARM (MISCELLANEOUS) ×3 IMPLANT
SET HNDPC FAN SPRY TIP SCT (DISPOSABLE) ×1 IMPLANT
SET PAD KNEE POSITIONER (MISCELLANEOUS) ×3 IMPLANT
SUCTION FRAZIER 12FR DISP (SUCTIONS) ×3 IMPLANT
SUT MNCRL AB 4-0 PS2 18 (SUTURE) ×3 IMPLANT
SUT VIC AB 1 CT1 36 (SUTURE) ×3 IMPLANT
SUT VIC AB 2-0 CT1 27 (SUTURE) ×6
SUT VIC AB 2-0 CT1 TAPERPNT 27 (SUTURE) ×3 IMPLANT
SUT VLOC 180 0 24IN GS25 (SUTURE) ×3 IMPLANT
SYR 50ML LL SCALE MARK (SYRINGE) ×3 IMPLANT
TRAY FOLEY W/METER SILVER 14FR (SET/KITS/TRAYS/PACK) IMPLANT
TRAY FOLEY W/METER SILVER 16FR (SET/KITS/TRAYS/PACK) ×3 IMPLANT
WATER STERILE IRR 1500ML POUR (IV SOLUTION) ×3 IMPLANT
WRAP KNEE MAXI GEL POST OP (GAUZE/BANDAGES/DRESSINGS) ×3 IMPLANT
YANKAUER SUCT BULB TIP 10FT TU (MISCELLANEOUS) ×3 IMPLANT

## 2015-12-18 NOTE — Progress Notes (Signed)
Clarified patient's allergy to hydrocodone. Patient states he has just had itching with hydrocodone.

## 2015-12-18 NOTE — Discharge Instructions (Signed)
Total Knee Replacement, Care After Refer to this sheet in the next few weeks. These instructions provide you with information on caring for yourself after your procedure. Your health care provider also may give you specific instructions. Your treatment has been planned according to the most current medical practices, but problems sometimes occur. Call your health care provider if you have any problems or questions after your procedure. HOME CARE INSTRUCTIONS   See a physical therapist as directed by your health care provider.  Do not take baths, swim, or use a hot tub until your health care provider approves.  Take medicines only as directed by your health care provider.  Avoid lifting or driving until you are instructed otherwise.  If you have been sent home with a continuous passive motion machine, use it as directed by your health care provider.  Rest often, but move around as much as you can tolerate. Movement helps you to heal and helps to prevent stiffness, skin sores, and blood clots.  Wear compression stockings as told by your health care provider. These stockings help to prevent blood clots and reduce swelling in your legs.  Follow instructions from your health care provider about how to take care of your incision. Make sure you:  Wash your hands with soap and water before you change your bandage (dressing). If soap and water are not available, use hand sanitizer.  Change your dressing as told by your health care provider.  Leave stitches (sutures), skin glue, or adhesive strips in place. These skin closures may need to be in place for 2 weeks or longer. If adhesive strip edges start to loosen and curl up, you may trim the loose edges. Do not remove adhesive strips completely unless your health care provider tells you to do that. SEEK MEDICAL CARE IF:  You have difficulty breathing.  You have drainage, redness, swelling, or pain at your incision site.  You have a bad  smell coming from your incision site.  You have persistent bleeding from your incision site.  Your incision breaks open after sutures (stitches) or staples have been removed.  You have a fever. SEEK IMMEDIATE MEDICAL CARE IF:   You have a rash.  You have pain or swelling in your calf or thigh.  You have shortness of breath or chest pain.  Your range of motion in your knee is decreasing rather than increasing.   This information is not intended to replace advice given to you by your health care provider. Make sure you discuss any questions you have with your health care provider.   Document Released: 06/24/2005 Document Revised: 08/26/2015 Document Reviewed: 01/24/2012 Elsevier Interactive Patient Education 2016 [place].     Spinal Anesthesia and Epidural Anesthesia, Care After Refer to this sheet in the next few weeks. These instructions provide you with information about caring for yourself after your procedure. Your health care provider may also give you more specific instructions. Your treatment has been planned according to current medical practices, but problems sometimes occur. Call your health care provider if you have any problems or questions after your procedure. WHAT TO EXPECT AFTER THE PROCEDURE After your procedure, it is typical to have the following:  Sleepiness.  Nausea and vomiting. HOME CARE INSTRUCTIONS  For the first 24 hours after anesthetic medicine:  Do not drive or operate heavy machinery.  Do not drink alcohol.  Do not make important decisions.  Have someone stay with you for at least 24-48 hours.  Drink  enough fluid to keep your urine clear or pale yellow. SEEK MEDICAL CARE IF:  You have nausea and vomiting that continue the day after anesthetic medicine was given.  You develop a rash. SEEK IMMEDIATE MEDICAL CARE IF:   You have a fever.  You have a persistent or severe headache.  You develop blurred or double vision.  You  develop dizziness or lightheadedness.  You faint.  You have weakness, numbness, or tingling in your arms or legs.  You have difficulty breathing.  You are unable to pass urine.   This information is not intended to replace advice given to you by your health care provider. Make sure you discuss any questions you have with your health care provider.   Document Released: 02/25/2004 Document Revised: 12/26/2014 Document Reviewed: 07/16/2014 Elsevier Interactive Patient Education [employer].

## 2015-12-18 NOTE — Op Note (Signed)
NAME:  Jeffrey Adkins RECORD NO.:  [id number]                             FACILITY:  [facility]      PHYSICIAN:  Pietro Cassis. Alvan Dame, M.D.  DATE OF BIRTH:  [birth year]      DATE OF PROCEDURE:  12/18/2015                                     OPERATIVE REPORT         PREOPERATIVE DIAGNOSIS:  Right knee osteoarthritis.      POSTOPERATIVE DIAGNOSIS:  Right knee osteoarthritis.      FINDINGS:  The patient was noted to have complete loss of cartilage and   bone-on-bone arthritis with associated osteophytes in all three compartments of   the knee with a significant synovitis, associated effusion, and significant osteophytes.      PROCEDURE:  Right total knee replacement.      COMPONENTS USED:  DePuy Attune rotating platform posterior stabilized knee   system, a size 6 femur, 7 tibia, size 7 mm PS AOX insert, and 38 anatomic patellar   button.      SURGEON:  Pietro Cassis. Alvan Dame, M.D.      ASSISTANT:  Danae Orleans, PA-C.      ANESTHESIA:  Spinal.      SPECIMENS:  None.      COMPLICATION:  None.      DRAINS:  None.  EBL: <100cc      TOURNIQUET TIME:   Total Tourniquet Time Documented: Thigh (Right) - 45 minutes Total: Thigh (Right) - 45 minutes  .      The patient was stable to the recovery room.      INDICATION FOR PROCEDURE:  Jeffrey Adkins is a 59 y.o. male patient of   mine.  The patient had been seen, evaluated, and treated conservatively in the   office with medication, activity modification, and injections.  The patient had   radiographic changes of bone-on-bone arthritis with endplate sclerosis and osteophytes noted.      The patient failed conservative measures including medication, injections, and activity modification, and at this point was ready for more definitive measures.   Based on the radiographic changes and failed conservative measures, the patient   decided to proceed with total knee replacement.  Risks of infection,   DVT, component  failure, need for revision surgery, postop course, and   expectations were all   discussed and reviewed.  Consent was obtained for benefit of pain   relief.      PROCEDURE IN DETAIL:  The patient was brought to the operative theater.   Once adequate anesthesia, preoperative antibiotics, 2 gm of Ancef, 1 gm of Tranexamic Acid, and 10 gm of Decadron administered, the patient was positioned supine with the right thigh tourniquet placed.  The  right lower extremity was prepped and draped in sterile fashion.  A time-   out was performed identifying the patient, planned procedure, and   extremity.      The right lower extremity was placed in the [facility] leg holder.  The leg was   exsanguinated, tourniquet elevated to 250 mmHg.  A midline incision was   made  followed by median parapatellar arthrotomy.  Following initial   exposure, attention was first directed to the patella.  Precut   measurement was noted to be 25 mm.  I resected down to 14 mm and used a   38 patellar button to restore patellar height as well as cover the cut   surface.      The lug holes were drilled and a metal shim was placed to protect the   patella from retractors and saw blades.      At this point, attention was now directed to the femur.  The femoral   canal was opened with a drill, irrigated to try to prevent fat emboli.  An   intramedullary rod was passed at 3 degrees valgus, 9 mm of bone was   resected off the distal femur.  Following this resection, the tibia was   subluxated anteriorly.  Using the extramedullary guide, 2 mm of bone was resected off   the proximal medial tibia.  We confirmed the gap would be   stable medially and laterally with a size 5 mm insert as well as confirmed   the cut was perpendicular in the coronal plane, checking with an alignment rod.      Once this was done, I sized the femur to be a size 6 in the anterior-   posterior dimension, chose a standard component based on medial and    lateral dimension.  The size 6 rotation block was then pinned in   position anterior referenced using the C-clamp to set rotation.  The   anterior, posterior, and  chamfer cuts were made without difficulty nor   notching making certain that I was along the anterior cortex to help   with flexion gap stability.      The final box cut was made off the lateral aspect of distal femur.      At this point, the tibia was sized to be a size 7, the size 7 tray was   then pinned in position through the medial third of the tubercle,   drilled, and keel punched.  Trial reduction was now carried with a 6 femur,  7 tibia, a size 7 mm PS AOX insert, and the 38 anatomic patella botton.  The knee was brought to   extension, full extension with good flexion stability with the patella   tracking through the trochlea without application of pressure.  Given   all these findings, the trial components removed.  Final components were   opened and cement was mixed.  The knee was irrigated with normal saline   solution and pulse lavage.  The synovial lining was   then injected with 30 cc of 0.25% Marcaine with epinephrine and 1 cc of Toradol plus 30 cc of NS for a    total of 61 cc.      The knee was irrigated.  Final implants were then cemented onto clean and   dried cut surfaces of bone with the knee brought to extension with a size 7 mm trial insert.      Once the cement had fully cured, the excess cement was removed   throughout the knee.  I confirmed I was satisfied with the range of   motion and stability, and the final size 7 mm PS AOX insert was chosen.  It was   placed into the knee.      The tourniquet had been let down at 45 minutes.  No significant  hemostasis required..  The   extensor mechanism was then reapproximated using #1 Vicryl and #0 Quill sutures with the knee   in flexion.  The   remaining wound was closed with 2-0 Vicryl and running 4-0 Monocryl.   The knee was cleaned, dried, dressed  sterilely using Dermabond and   Aquacel dressing.  The patient was then   brought to recovery room in stable condition, tolerating the procedure   well.   Please note that Physician Assistant, Danae Orleans, PA-C, was present for the entirety of the case, and was utilized for pre-operative positioning, peri-operative retractor management, general facilitation of the procedure.  He was also utilized for primary wound closure at the end of the case.              Pietro Cassis Alvan Dame, M.D.    12/18/2015 11:09 AM

## 2015-12-18 NOTE — Evaluation (Signed)
Physical Therapy Evaluation Patient Details Name: Jeffrey Adkins MRN: [id number] DOB: 1956-08-12 Today's Date: 12/18/2015   History of Present Illness  R TKR; hx partial foot drop on R  Clinical Impression  Pt s/p R TKR presents with functional mobility limitations 2* decreased R LE strength/ROM and post op pain limiting functional mobility.  Pt mobilizing at supervision level this date and plans dc home with follow up HHPT.    Follow Up Recommendations Home health PT    Equipment Recommendations  Rolling walker with 5" wheels;3in1 (PT)    Recommendations for Other Services       Precautions / Restrictions Precautions Precautions: Fall Restrictions Weight Bearing Restrictions: No Other Position/Activity Restrictions: WBAT      Mobility  Bed Mobility Overal bed mobility: Needs Assistance Bed Mobility: Supine to Sit;Sit to Supine     Supine to sit: Min guard;Supervision Sit to supine: Supervision   General bed mobility comments: min cues for sequence  Transfers Overall transfer level: Needs assistance Equipment used: Rolling walker (2 wheeled) Transfers: Sit to/from Stand Sit to Stand: Min guard         General transfer comment: cues for transition position and use of UEs to self assist  Ambulation/Gait Ambulation/Gait assistance: Min guard;Supervision Ambulation Distance (Feet): 111 Feet Assistive device: Rolling walker (2 wheeled) Gait Pattern/deviations: Step-to pattern;Step-through pattern;Shuffle;Trunk flexed Gait velocity: mod pace   General Gait Details: cues for posture and position from RW  Stairs Stairs: Yes Stairs assistance: Min assist Stair Management: No rails;Step to pattern;Backwards;With walker Number of Stairs: 3 General stair comments: cues for sequence and foot/RW placment.  Written instruction provided.  Spouse present  Wheelchair Mobility    Modified Rankin (Stroke Patients Only)       Balance                                              Pertinent Vitals/Pain Pain Assessment: 0-10 Pain Score: 3  Pain Location: R knee Pain Descriptors / Indicators: Aching;Sore Pain Intervention(s): Limited activity within patient's tolerance;Monitored during session;Premedicated before session;Ice applied    Home Living Family/patient expects to be discharged to:: Private residence Living Arrangements: Spouse/significant other Available Help at Discharge: Family Type of Home: House Home Access: Stairs to enter Entrance Stairs-Rails: None Technical brewer of Steps: 4 Home Layout: One level Home Equipment: None      Prior Function Level of Independence: Independent               Hand Dominance        Extremity/Trunk Assessment   Upper Extremity Assessment: Overall WFL for tasks assessed           Lower Extremity Assessment: RLE deficits/detail RLE Deficits / Details: 3/5 quads with AAROM at knee -5 - 95    Cervical / Trunk Assessment: Normal  Communication   Communication: No difficulties  Cognition Arousal/Alertness: Awake/alert Behavior During Therapy: WFL for tasks assessed/performed Overall Cognitive Status: Within Functional Limits for tasks assessed                      General Comments      Exercises Total Joint Exercises Ankle Circles/Pumps: AROM;Right;15 reps;Supine Quad Sets: AROM;Right;10 reps;Supine Heel Slides: AAROM;Right;15 reps;Supine Straight Leg Raises: AAROM;AROM;Right;15 reps;Supine      Assessment/Plan    PT Assessment Patient needs continued PT services  PT Diagnosis Difficulty  walking   PT Problem List Decreased strength;Decreased range of motion;Decreased activity tolerance;Decreased mobility;Decreased knowledge of use of DME;Pain  PT Treatment Interventions DME instruction;Gait training;Stair training;Functional mobility training;Therapeutic activities;Therapeutic exercise;Patient/family education   PT Goals (Current goals  can be found in the Care Plan section) Acute Rehab PT Goals Patient Stated Goal: Resume previous lifestyle with decreased pain PT Goal Formulation: With patient Time For Goal Achievement: 12/18/15 Potential to Achieve Goals: Good    Frequency Min 1X/week   Barriers to discharge        Co-evaluation               End of Session Equipment Utilized During Treatment: Gait belt Activity Tolerance: Patient tolerated treatment well Patient left: in bed;with call bell/phone within reach;with family/visitor present Nurse Communication: Mobility status    Functional Assessment Tool Used: Clinical judgement Functional Limitation: Mobility: Walking and moving around Mobility: Walking and Moving Around Current Status [phone]): At least 1 percent but less than 20 percent impaired, limited or restricted Mobility: Walking and Moving Around Goal Status [phone]): At least 1 percent but less than 20 percent impaired, limited or restricted Mobility: Walking and Moving Around Discharge Status [phone]): At least 1 percent but less than 20 percent impaired, limited or restricted    Time: [id number] PT Time Calculation (min) (ACUTE ONLY): 29 min   Charges:   PT Evaluation $Initial PT Evaluation Tier I: 1 Procedure PT Treatments $Therapeutic Exercise: 8-22 mins   PT G Codes:   PT G-Codes **NOT FOR INPATIENT CLASS** Functional Assessment Tool Used: Clinical judgement Functional Limitation: Mobility: Walking and moving around Mobility: Walking and Moving Around Current Status [id number]): At least 1 percent but less than 20 percent impaired, limited or restricted Mobility: Walking and Moving Around Goal Status [phone]): At least 1 percent but less than 20 percent impaired, limited or restricted Mobility: Walking and Moving Around Discharge Status [phone]): At least 1 percent but less than 20 percent impaired, limited or restricted    Jeffrey Adkins 12/18/2015, 5:48 PM

## 2015-12-18 NOTE — Anesthesia Postprocedure Evaluation (Signed)
Anesthesia Post Note  Patient: Jeffrey Adkins  Procedure(s) Performed: Procedure(s) (LRB): RIGHT TOTAL KNEE ARTHROPLASTY (Right)  Patient location during evaluation: PACU Anesthesia Type: Spinal Level of consciousness: oriented and awake and alert Pain management: pain level controlled Vital Signs Assessment: post-procedure vital signs reviewed and stable Respiratory status: spontaneous breathing, respiratory function stable and patient connected to nasal cannula oxygen Cardiovascular status: blood pressure returned to baseline and stable Postop Assessment: no headache and no backache Anesthetic complications: no    Last Vitals:  Filed Vitals:   12/18/15 1230 12/18/15 1254  BP: 111/74 125/76  Pulse: 53 54  Temp: 36.3 C 36.2 C  Resp: 12 12    Last Pain:  Filed Vitals:   12/18/15 1316  PainSc: 4                  Montez Hageman

## 2015-12-18 NOTE — Anesthesia Preprocedure Evaluation (Signed)
Anesthesia Evaluation  Patient identified by MRN, date of birth, ID band Patient awake    Reviewed: Allergy & Precautions, NPO status , Patient's Chart, lab work & pertinent test results  Airway Mallampati: II  TM Distance: >3 FB Neck ROM: Full    Dental no notable dental hx.    Pulmonary PE   Pulmonary exam normal breath sounds clear to auscultation       Cardiovascular negative cardio ROS Normal cardiovascular exam Rhythm:Regular Rate:Normal     Neuro/Psych R foot drop negative neurological ROS  negative psych ROS   GI/Hepatic negative GI ROS, Neg liver ROS,   Endo/Other  negative endocrine ROS  Renal/GU negative Renal ROS  negative genitourinary   Musculoskeletal negative musculoskeletal ROS (+)   Abdominal   Peds negative pediatric ROS (+)  Hematology negative hematology ROS (+)   Anesthesia Other Findings   Reproductive/Obstetrics negative OB ROS                             Anesthesia Physical Anesthesia Plan  ASA: II  Anesthesia Plan: Spinal   Post-op Pain Management:    Induction:   Airway Management Planned: Simple Face Mask  Additional Equipment:   Intra-op Plan:   Post-operative Plan:   Informed Consent: I have reviewed the patients History and Physical, chart, labs and discussed the procedure including the risks, benefits and alternatives for the proposed anesthesia with the patient or authorized representative who has indicated his/her understanding and acceptance.   Dental advisory given  Plan Discussed with: CRNA  Anesthesia Plan Comments:         Anesthesia Quick Evaluation

## 2015-12-18 NOTE — Interval H&P Note (Signed)
History and Physical Interval Note:  12/18/2015 8:46 AM  Jeffrey Adkins  has presented today for surgery, with the diagnosis of RIGHT KNEE OA  The various methods of treatment have been discussed with the patient and family. After consideration of risks, benefits and other options for treatment, the patient has consented to  Procedure(s): RIGHT TOTAL KNEE ARTHROPLASTY (Right) as a surgical intervention .  The patient's history has been reviewed, patient examined, no change in status, stable for surgery.  I have reviewed the patient's chart and labs.  Questions were answered to the patient's satisfaction.     Mauri Pole

## 2015-12-18 NOTE — Anesthesia Procedure Notes (Signed)
Spinal Patient location during procedure: OR Start time: 12/18/2015 9:26 AM End time: 12/18/2015 9:34 AM Reason for block: at surgeon's request Staffing Resident/CRNA: Anne Fu Performed by: resident/CRNA  Preanesthetic Checklist Completed: patient identified, site marked, surgical consent, pre-op evaluation, timeout performed, IV checked, risks and benefits discussed, monitors and equipment checked and at surgeon's request Spinal Block Patient position: sitting Approach: left paramedian Location: L2-3 Injection technique: single-shot Needle Needle type: Whitacre  Needle gauge: 25 G Needle length: 9 cm Assessment Sensory level: T6 Additional Notes Expiration date of kit checked and confirmed. Patient tolerated procedure well, without complications. X 1 attempt with noted clear CSF return. Loss of motor and sensory on exam post injection.

## 2015-12-18 NOTE — Transfer of Care (Signed)
Immediate Anesthesia Transfer of Care Note  Patient: [name]  Procedure(s) Performed: Procedure(s): RIGHT TOTAL KNEE ARTHROPLASTY (Right)  Patient Location: PACU  Anesthesia Type:Spinal  Level of Consciousness:  sedated, patient cooperative and responds to stimulation  Airway & Oxygen Therapy:Patient Spontanous Breathing and Patient connected to face mask oxgen  Post-op Assessment:  Report given to PACU RN and Post -op Vital signs reviewed and stable  Post vital signs:  Reviewed and stable  Last Vitals:  Filed Vitals:   12/18/15 0726  BP: 136/76  Pulse: 66  Temp: 36.4 C  Resp: 18    Complications: No apparent anesthesia complications

## 2016-04-07 ENCOUNTER — Encounter (HOSPITAL_COMMUNITY): Payer: Self-pay | Admitting: *Deleted

## 2016-04-07 ENCOUNTER — Other Ambulatory Visit: Payer: Self-pay | Admitting: Orthopedic Surgery

## 2016-04-07 NOTE — Progress Notes (Signed)
Pt denies SOB, chest pain, and being under the care of a cardiologist. Pt denies having a stress test , echo and cardiac cath. Pt stated that a CXR was completed at [facility] in December 2016; records requested. Pt made aware to stop  taking Aspirin, vitamins, fish oil and herbal medications. Do not take any NSAIDs ie: Ibuprofen, Advil, Naproxen, BC and Goody powder or any medication containing Aspirin. Pt stated that MD advised that it a light breakfast is okay if completed by 7:00.A.M. Pt verbalized understanding of all pre-op instructions.

## 2016-04-08 ENCOUNTER — Ambulatory Visit (HOSPITAL_COMMUNITY): Payer: 59 | Admitting: Anesthesiology

## 2016-04-08 ENCOUNTER — Ambulatory Visit (HOSPITAL_COMMUNITY)
Admission: RE | Admit: 2016-04-08 | Discharge: 2016-04-08 | Disposition: A | Discharge status: Home / Self Care | Payer: 59 | Source: Ambulatory Visit | Attending: Orthopedic Surgery | Admitting: Orthopedic Surgery

## 2016-04-08 ENCOUNTER — Encounter (HOSPITAL_COMMUNITY)
Admission: RE | Disposition: A | Discharge status: Home / Self Care | Payer: Self-pay | Source: Ambulatory Visit | Attending: Orthopedic Surgery

## 2016-04-08 ENCOUNTER — Encounter (HOSPITAL_COMMUNITY): Payer: Self-pay | Admitting: Anesthesiology

## 2016-04-08 DIAGNOSIS — K219 Gastro-esophageal reflux disease without esophagitis: Secondary | ICD-10-CM | POA: Diagnosis not present

## 2016-04-08 DIAGNOSIS — Z7901 Long term (current) use of anticoagulants: Secondary | ICD-10-CM | POA: Insufficient documentation

## 2016-04-08 DIAGNOSIS — S46311A Strain of muscle, fascia and tendon of triceps, right arm, initial encounter: Secondary | ICD-10-CM | POA: Diagnosis present

## 2016-04-08 DIAGNOSIS — X58XXXA Exposure to other specified factors, initial encounter: Secondary | ICD-10-CM | POA: Insufficient documentation

## 2016-04-08 DIAGNOSIS — Z89411 Acquired absence of right great toe: Secondary | ICD-10-CM | POA: Insufficient documentation

## 2016-04-08 DIAGNOSIS — Z87891 Personal history of nicotine dependence: Secondary | ICD-10-CM | POA: Insufficient documentation

## 2016-04-08 DIAGNOSIS — Z86711 Personal history of pulmonary embolism: Secondary | ICD-10-CM | POA: Insufficient documentation

## 2016-04-08 DIAGNOSIS — M21371 Foot drop, right foot: Secondary | ICD-10-CM | POA: Diagnosis not present

## 2016-04-08 DIAGNOSIS — Z96651 Presence of right artificial knee joint: Secondary | ICD-10-CM | POA: Insufficient documentation

## 2016-04-08 HISTORY — DX: Strain of muscle, fascia and tendon of triceps, right arm, initial encounter: S46.311A

## 2016-04-08 HISTORY — PX: ULNAR NERVE TRANSPOSITION: SHX2595

## 2016-04-08 HISTORY — PX: TRICEPS TENDON REPAIR: SHX2577

## 2016-04-08 LAB — BASIC METABOLIC PANEL
Anion gap: 12 (ref 5–15)
BUN: 15 mg/dL (ref 6–20)
CHLORIDE: 103 mmol/L (ref 101–111)
CO2: 25 mmol/L (ref 22–32)
CREATININE: 1.02 mg/dL (ref 0.61–1.24)
Calcium: 9.4 mg/dL (ref 8.9–10.3)
GFR calc Af Amer: >60 mL/min (ref >60)
GFR calc non Af Amer: >60 mL/min (ref >60)
Glucose, Bld: 89 mg/dL (ref 65–99)
Potassium: 4 mmol/L (ref 3.5–5.1)
SODIUM: 140 mmol/L (ref 135–145)

## 2016-04-08 LAB — CBC
HCT: 45.4 % (ref 39.0–52.0)
Hemoglobin: 15.6 g/dL (ref 13.0–17.0)
MCH: 29.1 pg (ref 26.0–34.0)
MCHC: 34.4 g/dL (ref 30.0–36.0)
MCV: 84.5 fL (ref 78.0–100.0)
PLATELETS: 178 10*3/uL (ref 150–400)
RBC: 5.37 MIL/uL (ref 4.22–5.81)
RDW: 13.2 % (ref 11.5–15.5)
WBC: 7.4 10*3/uL (ref 4.0–10.5)

## 2016-04-08 SURGERY — REPAIR, TENDON, TRICEPS
Anesthesia: General | Laterality: Right

## 2016-04-08 MED ORDER — EPHEDRINE SULFATE 50 MG/ML IJ SOLN
INTRAMUSCULAR | Status: DC | PRN
  Administered 2016-04-08 (×2): 10 mg via INTRAVENOUS

## 2016-04-08 MED ORDER — OXYCODONE HCL 5 MG PO TABS: 10.0000 mg | ORAL_TABLET | ORAL | Status: DC | PRN

## 2016-04-08 MED ORDER — 0.9 % SODIUM CHLORIDE (POUR BTL) OPTIME
TOPICAL | Status: DC | PRN
  Administered 2016-04-08: 1000 mL

## 2016-04-08 MED ORDER — FENTANYL CITRATE (PF) 250 MCG/5ML IJ SOLN
INTRAMUSCULAR | Status: AC
  Filled 2016-04-08: qty 5

## 2016-04-08 MED ORDER — SULFAMETHOXAZOLE-TRIMETHOPRIM 400-80 MG PO TABS: 1.0000 | ORAL_TABLET | Freq: Two times a day (BID) | ORAL | Status: DC

## 2016-04-08 MED ORDER — FENTANYL CITRATE (PF) 100 MCG/2ML IJ SOLN
INTRAMUSCULAR | Status: AC
  Filled 2016-04-08: qty 2

## 2016-04-08 MED ORDER — PROPOFOL 10 MG/ML IV BOLUS
INTRAVENOUS | Status: AC
  Filled 2016-04-08: qty 20

## 2016-04-08 MED ORDER — CEFAZOLIN SODIUM-DEXTROSE 2-4 GM/100ML-% IV SOLN
2.0000 g | INTRAVENOUS | Status: AC
  Administered 2016-04-08: 2 g via INTRAVENOUS
  Filled 2016-04-08: qty 100

## 2016-04-08 MED ORDER — MIDAZOLAM HCL 2 MG/2ML IJ SOLN
INTRAMUSCULAR | Status: AC
  Filled 2016-04-08: qty 2

## 2016-04-08 MED ORDER — FENTANYL CITRATE (PF) 100 MCG/2ML IJ SOLN
INTRAMUSCULAR | Status: DC | PRN
  Administered 2016-04-08: 50 ug via INTRAVENOUS
  Administered 2016-04-08: 150 ug via INTRAVENOUS

## 2016-04-08 MED ORDER — OXYCODONE HCL 5 MG PO TABS
10.0000 mg | ORAL_TABLET | ORAL | Status: DC | PRN
  Administered 2016-04-08: 10 mg via ORAL

## 2016-04-08 MED ORDER — OXYCODONE HCL 5 MG PO TABS
ORAL_TABLET | ORAL | Status: AC
  Filled 2016-04-08: qty 2

## 2016-04-08 MED ORDER — BUPIVACAINE HCL (PF) 0.25 % IJ SOLN
INTRAMUSCULAR | Status: DC | PRN
  Administered 2016-04-08: 10 mL

## 2016-04-08 MED ORDER — MIDAZOLAM HCL 5 MG/5ML IJ SOLN
INTRAMUSCULAR | Status: DC | PRN
  Administered 2016-04-08: 2 mg via INTRAVENOUS

## 2016-04-08 MED ORDER — HYDROMORPHONE HCL 1 MG/ML IJ SOLN
INTRAMUSCULAR | Status: AC
  Filled 2016-04-08: qty 1

## 2016-04-08 MED ORDER — METHOCARBAMOL 500 MG PO TABS: 500.0000 mg | ORAL_TABLET | Freq: Four times a day (QID) | ORAL | Status: DC

## 2016-04-08 MED ORDER — NEOMYCIN-POLYMYXIN-GRAMICIDIN 1.75-10000-.025 OP SOLN
OPHTHALMIC | Status: AC
Start: 2016-04-08 — End: 2016-04-08
  Filled 2016-04-08: qty 10

## 2016-04-08 MED ORDER — ONDANSETRON HCL 4 MG/2ML IJ SOLN
INTRAMUSCULAR | Status: AC
  Filled 2016-04-08: qty 2

## 2016-04-08 MED ORDER — HYDROMORPHONE HCL 1 MG/ML IJ SOLN
0.2500 mg | INTRAMUSCULAR | Status: DC | PRN
  Administered 2016-04-08: 0.5 mg via INTRAVENOUS

## 2016-04-08 MED ORDER — ROCURONIUM BROMIDE 100 MG/10ML IV SOLN
INTRAVENOUS | Status: DC | PRN
Start: 2016-04-08 — End: 2016-04-08
  Administered 2016-04-08 (×2): 20 mg via INTRAVENOUS
  Administered 2016-04-08: 10 mg via INTRAVENOUS
  Administered 2016-04-08: 20 mg via INTRAVENOUS

## 2016-04-08 MED ORDER — BUPIVACAINE HCL (PF) 0.25 % IJ SOLN
INTRAMUSCULAR | Status: AC
  Filled 2016-04-08: qty 30

## 2016-04-08 MED ORDER — PROPOFOL 10 MG/ML IV BOLUS
INTRAVENOUS | Status: DC | PRN
  Administered 2016-04-08: 160 mg via INTRAVENOUS

## 2016-04-08 MED ORDER — ONDANSETRON HCL 4 MG/2ML IJ SOLN
INTRAMUSCULAR | Status: DC | PRN
  Administered 2016-04-08: 4 mg via INTRAVENOUS

## 2016-04-08 MED ORDER — MIDAZOLAM HCL 2 MG/2ML IJ SOLN
INTRAMUSCULAR | Status: AC
Start: 2016-04-08 — End: 2016-04-08
  Filled 2016-04-08: qty 2

## 2016-04-08 MED ORDER — CHLORHEXIDINE GLUCONATE 4 % EX LIQD: 60.0000 mL | Freq: Once | CUTANEOUS | Status: DC

## 2016-04-08 MED ORDER — LIDOCAINE HCL (CARDIAC) 20 MG/ML IV SOLN
INTRAVENOUS | Status: DC | PRN
  Administered 2016-04-08: 80 mg via INTRAVENOUS

## 2016-04-08 MED ORDER — LACTATED RINGERS IV SOLN
INTRAVENOUS | Status: DC
  Administered 2016-04-08: 15:00:00 via INTRAVENOUS

## 2016-04-08 SURGICAL SUPPLY — 58 items
BANDAGE ACE 4X5 VEL STRL LF (GAUZE/BANDAGES/DRESSINGS) ×6 IMPLANT
BANDAGE ELASTIC 3 VELCRO ST LF (GAUZE/BANDAGES/DRESSINGS) IMPLANT
BANDAGE ELASTIC 4 VELCRO ST LF (GAUZE/BANDAGES/DRESSINGS) IMPLANT
BNDG GAUZE ELAST 4 BULKY (GAUZE/BANDAGES/DRESSINGS) ×2 IMPLANT
CONNECTOR 5 IN 1 STRAIGHT STRL (MISCELLANEOUS) ×2 IMPLANT
CORDS BIPOLAR (ELECTRODE) ×2 IMPLANT
COVER SURGICAL LIGHT HANDLE (MISCELLANEOUS) ×2 IMPLANT
CUFF TOURNIQUET SINGLE 18IN (TOURNIQUET CUFF) ×2 IMPLANT
CUFF TOURNIQUET SINGLE 24IN (TOURNIQUET CUFF) IMPLANT
DECANTER SPIKE VIAL GLASS SM (MISCELLANEOUS) ×2 IMPLANT
DRAPE INCISE IOBAN 66X45 STRL (DRAPES) ×2 IMPLANT
DRAPE OEC MINIVIEW 54X84 (DRAPES) IMPLANT
DRAPE SURG 17X23 STRL (DRAPES) ×2 IMPLANT
DRSG ADAPTIC 3X8 NADH LF (GAUZE/BANDAGES/DRESSINGS) ×2 IMPLANT
DRSG PAD ABDOMINAL 8X10 ST (GAUZE/BANDAGES/DRESSINGS) ×6 IMPLANT
EVACUATOR 1/8 PVC DRAIN (DRAIN) ×2 IMPLANT
GAUZE SPONGE 4X4 12PLY STRL (GAUZE/BANDAGES/DRESSINGS) ×2 IMPLANT
GAUZE XEROFORM 1X8 LF (GAUZE/BANDAGES/DRESSINGS) IMPLANT
GAUZE XEROFORM 5X9 LF (GAUZE/BANDAGES/DRESSINGS) ×2 IMPLANT
GLOVE BIOGEL M 8.0 STRL (GLOVE) ×2 IMPLANT
GLOVE SS BIOGEL STRL SZ 8 (GLOVE) ×1 IMPLANT
GLOVE SUPERSENSE BIOGEL SZ 8 (GLOVE) ×1
GOWN STRL REUS W/ TWL LRG LVL3 (GOWN DISPOSABLE) ×2 IMPLANT
GOWN STRL REUS W/ TWL XL LVL3 (GOWN DISPOSABLE) ×3 IMPLANT
GOWN STRL REUS W/TWL LRG LVL3 (GOWN DISPOSABLE) ×2
GOWN STRL REUS W/TWL XL LVL3 (GOWN DISPOSABLE) ×3
KIT BASIN OR (CUSTOM PROCEDURE TRAY) ×2 IMPLANT
KIT ROOM TURNOVER OR (KITS) ×2 IMPLANT
MANIFOLD NEPTUNE II (INSTRUMENTS) IMPLANT
NDL SUT 6 .5 CRC .975X.05 MAYO (NEEDLE) ×1 IMPLANT
NEEDLE HYPO 25GX1X1/2 BEV (NEEDLE) IMPLANT
NEEDLE KEITH (NEEDLE) ×2 IMPLANT
NEEDLE MAYO TAPER (NEEDLE) ×1
NS IRRIG 1000ML POUR BTL (IV SOLUTION) ×2 IMPLANT
PACK ORTHO EXTREMITY (CUSTOM PROCEDURE TRAY) ×2 IMPLANT
PAD ARMBOARD 7.5X6 YLW CONV (MISCELLANEOUS) ×4 IMPLANT
PAD CAST 4YDX4 CTTN HI CHSV (CAST SUPPLIES) ×1 IMPLANT
PADDING CAST COTTON 4X4 STRL (CAST SUPPLIES) ×1
PASSER SUT SWANSON 36MM LOOP (INSTRUMENTS) ×2 IMPLANT
SOLUTION BETADINE 4OZ (MISCELLANEOUS) ×2 IMPLANT
SPECIMEN JAR SMALL (MISCELLANEOUS) ×2 IMPLANT
SPLINT FIBERGLASS 4X30 (CAST SUPPLIES) ×2 IMPLANT
SPONGE SCRUB IODOPHOR (GAUZE/BANDAGES/DRESSINGS) ×2 IMPLANT
SUCTION FRAZIER HANDLE 10FR (MISCELLANEOUS)
SUCTION TUBE FRAZIER 10FR DISP (MISCELLANEOUS) IMPLANT
SUT FIBERWIRE #2 38 T-5 BLUE (SUTURE)
SUT MERSILENE 4 0 P 3 (SUTURE) IMPLANT
SUT PROLENE 3 0 PS 2 (SUTURE) ×6 IMPLANT
SUT PROLENE 4 0 PS 2 18 (SUTURE) IMPLANT
SUT VIC AB 2-0 CT1 27 (SUTURE) ×1
SUT VIC AB 2-0 CT1 TAPERPNT 27 (SUTURE) ×1 IMPLANT
SUTURE FIBERWR #2 38 T-5 BLUE (SUTURE) IMPLANT
SYR CONTROL 10ML LL (SYRINGE) IMPLANT
TOWEL OR 17X24 6PK STRL BLUE (TOWEL DISPOSABLE) ×2 IMPLANT
TOWEL OR 17X26 10 PK STRL BLUE (TOWEL DISPOSABLE) ×2 IMPLANT
TUBE CONNECTING 12X1/4 (SUCTIONS) IMPLANT
UNDERPAD 30X30 INCONTINENT (UNDERPADS AND DIAPERS) ×2 IMPLANT
WATER STERILE IRR 1000ML POUR (IV SOLUTION) IMPLANT

## 2016-04-08 NOTE — Op Note (Signed)
NAME:  Jeffrey Adkins, Jeffrey Adkins NO.:  [id number]  MEDICAL RECORD NO.:  [id number]  LOCATION:  [facility]                         FACILITY:  [facility]  PHYSICIAN:  Satira Anis. Keara Pagliarulo, M.D.DATE OF BIRTH:  1956-12-12  DATE OF PROCEDURE: DATE OF DISCHARGE:  04/08/2016                              OPERATIVE REPORT   PREOPERATIVE DIAGNOSIS:  Triceps rupture, right arm/elbow.  POSTOPERATIVE DIAGNOSIS:  Triceps rupture, right arm/elbow.  PROCEDURE: 1. Ulnar nerve release, right elbow. 2. Extensive bursectomy, right elbow. 3. Triceps tendon repair reconstruction with bony drill hole     technique, right elbow.  SURGEON:  Satira Anis. Amedeo Plenty, MD  ASSISTANT:  None.  COMPLICATION:  None.  DRAINS:  One.  INDICATIONS:  The patient is a 60 year old male, active with acute triceps rupture.  He has a significant bony spur formation and bursitis.  We are planning the above-mentioned operative intervention.  He understands the do's and don'ts, etc.  OPERATIVE PROCEDURE:  The patient was seen by myself and Anesthesia, taken to operative suite theater, underwent smooth induction of general anesthesia, prepped and draped in usual sterile fashion with pre-scrub with Hibiclens followed by 10 minutes surgical Betadine scrub and paint by myself.  Time-out was called.  Preoperative antibiotics were given. He was in a sloppy lateral position with SCDs applied and body parts well padded.  Operation commenced with a posterior incision.  Dissection was carried out.  Skin flaps were elevated.  The patient underwent an extensive bursectomy without difficulty.  Bursa was removed following bursectomy.  The patient then underwent a very careful and cautious approach to the ulnar nerve which was released.  The [place] of [place]hers, medial intermuscular septum, Osborne's ligament, and cubital tunnel as well as portions of the 2 heads of the FCU were released. Given that the radical shortening with triceps  repair, we certainly did not want any tension on the triceps.  Following this, I then very carefully outlined the triceps rupture, this was debrided.  Bony spur was removed which was fractured off the elbow. Following this, I removed additional spurs off the olecranon.  This was a bony spur removal about the olecranon and major bony debridement. This was performed with curette, rongeur, and osteotome.  Following this, I then made drill holes crisscross in nature in the olecranon.  I found the exiting sites and placed [place] needles through these holes. This was done very carefully.  Following this, we then very meticulously performed debridement of the triceps followed by placement of a modified Krackow stitch with #2 FiberWire.  These sutures were passed out distally into the tendon through the eyelets of Keith needle and then advanced.  I then tied down with the elbow in full extension to the triceps repair.  The patient tolerated this well.  There were no complicating features. The patient had excellent repair.  I then oversewed the repair distally for a nice smooth contour.  This was done with Vicryl suture of the 2-0 variety.  Following this, tourniquet was deflated.  Drain placed.  Wound was closed with 3-0 Prolene over the drain.  He was placed in full extension.  There were no complicating features.  I checked the  ulnar nerve multiple times and at the end of the case, hemostasis was secured with bipolar electrocautery prior to closure.  The patient tolerated the procedure quite nicely.  There were no complicating issues or features. I did take intraoperative video for the patient.  Following the operative intervention, I discussed all issues with his family.  We will allow him to go home if he is comfortable in the recovery room.  Do's and don'ts have been discussed and all questions have been encouraged and answered.  Should any problems arise, will be immediately  available, otherwise look forward to seeing him in 12-14 days with our standard triceps algorithm postop.     Satira Anis. Amedeo Plenty, M.D.     [facility]  D:  04/08/2016  T:  04/08/2016  Job:  [id number]

## 2016-04-08 NOTE — Op Note (Signed)
See dictation [id number] Amedeo Plenty MD

## 2016-04-08 NOTE — Anesthesia Preprocedure Evaluation (Addendum)
Anesthesia Evaluation  Patient identified by MRN, date of birth, ID band Patient awake    Reviewed: Allergy & Precautions, NPO status , Patient's Chart, lab work & pertinent test results  Airway Mallampati: II  TM Distance: >3 FB Neck ROM: Full    Dental no notable dental hx. (+) Teeth Intact, Caps   Pulmonary asthma , pneumonia, resolved,  Solitary pulmonary nodule   Pulmonary exam normal breath sounds clear to auscultation       Cardiovascular negative cardio ROS Normal cardiovascular exam Rhythm:Regular Rate:Normal  Hx/o PTE   Neuro/Psych Right foot drop negative psych ROS   GI/Hepatic Neg liver ROS, GERD  Medicated and Controlled,  Endo/Other  negative endocrine ROS  Renal/GU negative Renal ROS  negative genitourinary   Musculoskeletal  (+) Arthritis , Osteoarthritis,    Abdominal   Peds  Hematology negative hematology ROS (+)   Anesthesia Other Findings   Reproductive/Obstetrics                            Anesthesia Physical Anesthesia Plan  ASA: II  Anesthesia Plan: General   Post-op Pain Management:    Induction: Intravenous  Airway Management Planned: Oral ETT  Additional Equipment:   Intra-op Plan:   Post-operative Plan: Extubation in OR  Informed Consent: I have reviewed the patients History and Physical, chart, labs and discussed the procedure including the risks, benefits and alternatives for the proposed anesthesia with the patient or authorized representative who has indicated his/her understanding and acceptance.   Dental advisory given  Plan Discussed with: Anesthesiologist, CRNA and Surgeon  Anesthesia Plan Comments:         Anesthesia Quick Evaluation

## 2016-04-08 NOTE — Anesthesia Postprocedure Evaluation (Signed)
Anesthesia Post Note  Patient: Jeffrey Adkins  Procedure(s) Performed: Procedure(s) (LRB): RIGHT TRICEPS REPAIR BURSECTOMY  (Right) ULNAR NERVE DECOMPRESSION (Right)  Patient location during evaluation: PACU Anesthesia Type: General Level of consciousness: awake and alert Pain management: pain level controlled Vital Signs Assessment: post-procedure vital signs reviewed and stable Respiratory status: spontaneous breathing, nonlabored ventilation and respiratory function stable Cardiovascular status: blood pressure returned to baseline and stable Postop Assessment: no signs of nausea or vomiting Anesthetic complications: no    Last Vitals:  Filed Vitals:   04/08/16 1922 04/08/16 1937  BP: 117/67 130/70  Pulse: 69 63  Temp:    Resp: 12 12    Last Pain:  Filed Vitals:   04/08/16 1940  PainSc: 4                  Christop Hippert,W. EDMOND

## 2016-04-08 NOTE — Anesthesia Procedure Notes (Signed)
Procedure Name: Intubation Date/Time: 04/08/2016 5:29 PM Performed by: Salli Quarry Marquies Wanat Pre-anesthesia Checklist: Patient identified, Emergency Drugs available, Suction available and Patient being monitored Patient Re-evaluated:Patient Re-evaluated prior to inductionOxygen Delivery Method: Circle System Utilized Preoxygenation: Pre-oxygenation with 100% oxygen Intubation Type: IV induction Ventilation: Mask ventilation without difficulty Laryngoscope Size: Mac and 4 Grade View: Grade I Tube type: Oral Tube size: 7.5 mm Number of attempts: 1 Airway Equipment and Method: Stylet Placement Confirmation: ETT inserted through vocal cords under direct vision,  positive ETCO2 and breath sounds checked- equal and bilateral Secured at: 23 cm Tube secured with: Tape Dental Injury: Teeth and Oropharynx as per pre-operative assessment

## 2016-04-08 NOTE — H&P (Signed)
Jeffrey Adkins is an 60 y.o. male.   Chief Complaint: Right triceps rupture HPI: Patient presents for repair right triceps rupture distally at the elbow.  He notes no locking popping catching   he has no other injury  Past Medical History  Diagnosis Date  . Pulmonary embolism ([place])     right lung  . Childhood asthma   . Allergic rhinitis   . Solitary pulmonary nodule 02/21/13    CT-PA performed 02/21/13. Enlarging on repeat CT scan 02/26/14. PET performed 02/28/14.   Marland Kitchen Foot drop, right   . Numbness and tingling     right leg   . Tinnitus   . Closed head injury with concussion ([place])     multiple times secondary to sports/rock climbing  . Vertigo   . Pneumonia     last episode 4 to 5 years ago   . Gallstones   . GERD (gastroesophageal reflux disease)   . Arthritis   . Staph infection     right knee   . Rupture of right triceps tendon     Past Surgical History  Procedure Laterality Date  . Back surgery    . Knee surgery      right knee scoped 1992; I&D 6 times;   . Ankle surgery    . Amputation great right toe     . Picc line place peripheral ([facility] hx)      history of in 1992 secondary to staph infection   . Left ring finger       secondary to tendon tear   . Eye surgery      LASIK 1996   . Total knee arthroplasty Right 12/18/2015    Procedure: RIGHT TOTAL KNEE ARTHROPLASTY;  Surgeon: Paralee Cancel, MD;  Location: [facility];  Service: Orthopedics;  Laterality: Right;    Family History  Problem Relation Age of Onset  . Hypertension Mother   . Sjogren's syndrome Mother   . Celiac disease Sister    Social History:  reports that he has never smoked. He quit smokeless tobacco use about 41 years ago. His smokeless tobacco use included Chew. He reports that he does not drink alcohol or use illicit drugs.  Allergies:  Allergies  Allergen Reactions  . Peanut-Containing Drug Products Anaphylaxis    All nuts  . Madelaine Bhat Isothiocyanate] Other (See Comments)    other  .  Hydrocodone Itching    Pt clarified only itching, NOT hives    Medications Prior to Admission  Medication Sig Dispense Refill  . loratadine (CLARITIN) 10 MG tablet Take 10 mg by mouth daily as needed for allergies (only during allergy season).     . rivaroxaban (XARELTO) 10 MG TABS tablet Take 1 tablet (10 mg total) by mouth daily. 14 tablet 0    Results for orders placed or performed during the hospital encounter of 04/08/16 (from the past 48 hour(s))  Basic metabolic panel     Status: None   Collection Time: 04/08/16  3:05 PM  Result Value Ref Range   Sodium 140 135 - 145 mmol/L   Potassium 4.0 3.5 - 5.1 mmol/L   Chloride 103 101 - 111 mmol/L   CO2 25 22 - 32 mmol/L   Glucose, Bld 89 65 - 99 mg/dL   BUN 15 6 - 20 mg/dL   Creatinine, Ser 1.02 0.61 - 1.24 mg/dL   Calcium 9.4 8.9 - 10.3 mg/dL   GFR calc non Af Amer >60 >60 mL/min   GFR  calc Af Amer >60 >60 mL/min    Comment: (NOTE) The eGFR has been calculated using the CKD EPI equation. This calculation has not been validated in all clinical situations. eGFR's persistently <60 mL/min signify possible Chronic Kidney Disease.    Anion gap 12 5 - 15  CBC     Status: None   Collection Time: 04/08/16  3:05 PM  Result Value Ref Range   WBC 7.4 4.0 - 10.5 K/uL   RBC 5.37 4.22 - 5.81 MIL/uL   Hemoglobin 15.6 13.0 - 17.0 g/dL   HCT 45.4 39.0 - 52.0 %   MCV 84.5 78.0 - 100.0 fL   MCH 29.1 26.0 - 34.0 pg   MCHC 34.4 30.0 - 36.0 g/dL   RDW 13.2 11.5 - 15.5 %   Platelets 178 150 - 400 K/uL   No results found.  Review of Systems  Respiratory: Negative.   Genitourinary: Negative.   Neurological: Negative.   Endo/Heme/Allergies: Negative.     Blood pressure 133/78, pulse 58, temperature 98.1 F (36.7 C), temperature source Oral, resp. rate 18, height 5' 11"  (1.803 m), weight 79.379 kg (175 lb), SpO2 100 %. Physical Exam  Right distal triceps rupture with defect and bony abnormality  The patient is alert and oriented in no  acute distress. The patient complains of pain in the affected upper extremity.  The patient is noted to have a normal HEENT exam. Lung fields show equal chest expansion and no shortness of breath. Abdomen exam is nontender without distention. Lower extremity examination does not show any fracture dislocation or blood clot symptoms. Pelvis is stable and the neck and back are stable and nontender. Assessment/Plan Will plan for repair distal triceps rupture with associated ulnar nerve release and bursectomy he understands risks and benefits and we will proceed  We are planning surgery for your upper extremity. The risk and benefits of surgery to include risk of bleeding, infection, anesthesia,  damage to normal structures and failure of the surgery to accomplish its intended goals of relieving symptoms and restoring function have been discussed in detail. With this in mind we plan to proceed. I have specifically discussed with the patient the pre-and postoperative regime and the dos and don'ts and risk and benefits in great detail. Risk and benefits of surgery also include risk of dystrophy(CRPS), chronic nerve pain, failure of the healing process to go onto completion and other inherent risks of surgery The relavent the pathophysiology of the disease/injury process, as well as the alternatives for treatment and postoperative course of action has been discussed in great detail with the patient who desires to proceed.  We will do everything in our power to help you (the patient) restore function to the upper extremity. It is a pleasure to see this patient today.   Paulene Floor, MD 04/08/2016, 5:17 PM

## 2016-04-08 NOTE — Discharge Instructions (Signed)

## 2016-04-08 NOTE — Transfer of Care (Signed)
Immediate Anesthesia Transfer of Care Note  Patient: Jeffrey Adkins  Procedure(s) Performed: Procedure(s): RIGHT TRICEPS REPAIR BURSECTOMY  (Right) ULNAR NERVE DECOMPRESSION (Right)  Patient Location: PACU  Anesthesia Type:General  Level of Consciousness: awake  Airway & Oxygen Therapy: Patient Spontanous Breathing  Post-op Assessment: Report given to RN and Post -op Vital signs reviewed and stable  Post vital signs: Reviewed and stable  Last Vitals:  Filed Vitals:   04/08/16 1455 04/08/16 1907  BP: 133/78 123/74  Pulse: 58 79  Temp: 36.7 C 36.5 C  Resp: 18 15    Complications: No apparent anesthesia complications

## 2016-04-08 NOTE — Progress Notes (Signed)
PCR ordered by Dr. Vanetta Shawl office.  Nurse contacted to [facility] [employer], [place] to question swab order.  Per Aaron Edelman, swab was canceled.  Patient has no history of MRSA.

## 2016-04-11 ENCOUNTER — Encounter (HOSPITAL_COMMUNITY): Payer: Self-pay | Admitting: Orthopedic Surgery

## 2016-11-03 ENCOUNTER — Encounter (HOSPITAL_BASED_OUTPATIENT_CLINIC_OR_DEPARTMENT_OTHER): Payer: Self-pay | Admitting: *Deleted

## 2016-11-03 ENCOUNTER — Emergency Department (HOSPITAL_BASED_OUTPATIENT_CLINIC_OR_DEPARTMENT_OTHER)
Admission: EM | Admit: 2016-11-03 | Discharge: 2016-11-03 | Disposition: A | Discharge status: Home / Self Care | Payer: 59 | Attending: Emergency Medicine | Admitting: Emergency Medicine

## 2016-11-03 ENCOUNTER — Emergency Department (HOSPITAL_BASED_OUTPATIENT_CLINIC_OR_DEPARTMENT_OTHER): Payer: 59

## 2016-11-03 DIAGNOSIS — R110 Nausea: Secondary | ICD-10-CM | POA: Insufficient documentation

## 2016-11-03 DIAGNOSIS — R1013 Epigastric pain: Secondary | ICD-10-CM | POA: Insufficient documentation

## 2016-11-03 DIAGNOSIS — F17220 Nicotine dependence, chewing tobacco, uncomplicated: Secondary | ICD-10-CM | POA: Insufficient documentation

## 2016-11-03 DIAGNOSIS — R079 Chest pain, unspecified: Secondary | ICD-10-CM | POA: Insufficient documentation

## 2016-11-03 LAB — COMPREHENSIVE METABOLIC PANEL
ALT: 86 U/L — AB (ref 17–63)
ANION GAP: 8 (ref 5–15)
AST: 36 U/L (ref 15–41)
Albumin: 4.3 g/dL (ref 3.5–5.0)
Alkaline Phosphatase: 95 U/L (ref 38–126)
BUN: 15 mg/dL (ref 6–20)
CALCIUM: 9.9 mg/dL (ref 8.9–10.3)
CHLORIDE: 105 mmol/L (ref 101–111)
CO2: 26 mmol/L (ref 22–32)
CREATININE: 0.92 mg/dL (ref 0.61–1.24)
Glucose, Bld: 102 mg/dL — ABNORMAL HIGH (ref 65–99)
Potassium: 3.9 mmol/L (ref 3.5–5.1)
Sodium: 139 mmol/L (ref 135–145)
Total Bilirubin: 0.8 mg/dL (ref 0.3–1.2)
Total Protein: 7 g/dL (ref 6.5–8.1)

## 2016-11-03 LAB — CBC WITH DIFFERENTIAL/PLATELET
BASOS PCT: 1 %
Basophils Absolute: 0.1 10*3/uL (ref 0.0–0.1)
EOS ABS: 0.2 10*3/uL (ref 0.0–0.7)
Eosinophils Relative: 3 %
HEMATOCRIT: 42.7 % (ref 39.0–52.0)
HEMOGLOBIN: 15.6 g/dL (ref 13.0–17.0)
LYMPHS ABS: 1.7 10*3/uL (ref 0.7–4.0)
Lymphocytes Relative: 19 %
MCH: 30.9 pg (ref 26.0–34.0)
MCHC: 36.5 g/dL — ABNORMAL HIGH (ref 30.0–36.0)
MCV: 84.6 fL (ref 78.0–100.0)
Monocytes Absolute: 0.7 10*3/uL (ref 0.1–1.0)
Monocytes Relative: 8 %
NEUTROS ABS: 6.3 10*3/uL (ref 1.7–7.7)
NEUTROS PCT: 69 %
Platelets: 208 10*3/uL (ref 150–400)
RBC: 5.05 MIL/uL (ref 4.22–5.81)
RDW: 13.7 % (ref 11.5–15.5)
WBC: 9 10*3/uL (ref 4.0–10.5)

## 2016-11-03 LAB — URINALYSIS, ROUTINE W REFLEX MICROSCOPIC
BILIRUBIN URINE: NEGATIVE
GLUCOSE, UA: NEGATIVE mg/dL
HGB URINE DIPSTICK: NEGATIVE
Ketones, ur: NEGATIVE mg/dL
Leukocytes, UA: NEGATIVE
Nitrite: NEGATIVE
PROTEIN: NEGATIVE mg/dL
Specific Gravity, Urine: 1.018 (ref 1.005–1.030)
pH: 5 (ref 5.0–8.0)

## 2016-11-03 LAB — TROPONIN I

## 2016-11-03 LAB — LIPASE, BLOOD: LIPASE: 32 U/L (ref 11–51)

## 2016-11-03 LAB — D-DIMER, QUANTITATIVE: D-Dimer, Quant: 0.4 ug/mL-FEU (ref 0.00–0.50)

## 2016-11-03 MED ORDER — FENTANYL CITRATE (PF) 100 MCG/2ML IJ SOLN
100.0000 ug | Freq: Once | INTRAMUSCULAR | Status: AC
Start: 2016-11-03 — End: 2016-11-03
  Administered 2016-11-03: 100 ug via INTRAVENOUS
  Filled 2016-11-03: qty 2

## 2016-11-03 MED ORDER — ONDANSETRON HCL 4 MG/2ML IJ SOLN
4.0000 mg | Freq: Once | INTRAMUSCULAR | Status: AC
  Administered 2016-11-03: 4 mg via INTRAVENOUS
  Filled 2016-11-03: qty 2

## 2016-11-03 MED ORDER — FENTANYL CITRATE (PF) 100 MCG/2ML IJ SOLN
100.0000 ug | Freq: Once | INTRAMUSCULAR | Status: AC
  Administered 2016-11-03: 100 ug via INTRAVENOUS
  Filled 2016-11-03: qty 2

## 2016-11-03 MED ORDER — GI COCKTAIL ~~LOC~~
30.0000 mL | Freq: Once | ORAL | Status: AC
  Administered 2016-11-03: 30 mL via ORAL
  Filled 2016-11-03: qty 30

## 2016-11-03 NOTE — ED Provider Notes (Addendum)
Please see previous physicians note regarding patient's presenting history and physical, initial ED course, and associated medical decision making.  In short, this is 60 year old male who presents with epigastric abdominal pain and back pain. History of PE and cholelithiasis. Blood work reassuring. EKG and troponin normal. Pending [place] RUQ at time of sign out.  10:07AM right upper quadrant ultrasound visualized, showing cholelithiasis but no cholecystitis. No murphy's sign. Still with ongoing mild to moderate epigastric/LUQ pain and left mid posterior chest pain. Will send for dimer given that his prior PE did have some similar characteristics to today's presentation  10:40 AM D-dimer is normal. He is moderate risk PE, and felt to be adequately ruled out at this time. Patient still has some lingering mild burning epigastric pain. Abdomen benign on my exam. Patient expressed that he was comfortably with the work-up he has had and ready for discharge w/o further work-up. I think this is reasonable. Discussed strict return and follow-up instructions. He expressed understanding of all d/c instructions and felt comfortable with the plan of care.   Forde Dandy, MD 11/03/16 [place] Caylyn Tedeschi, MD 11/03/16 [phone]

## 2016-11-03 NOTE — ED Notes (Signed)
ED Provider at bedside. 

## 2016-11-03 NOTE — Discharge Instructions (Signed)
Your blood work was reassuring today.  The ultrasound of the gallbladder showed stoned but no infection.  Please return without fail for worsening symptoms, including fever, intractable vomiting, escalating pain or any other symptoms concerning to you.

## 2016-11-03 NOTE — ED Triage Notes (Signed)
C/o pain and burning in upper abd radiating into back onset 8 hours ago denies n/v/d

## 2016-11-03 NOTE — ED Notes (Signed)
Pt verbalized understanding of discharge instructions and denies any further questions at this time.   

## 2016-11-03 NOTE — ED Notes (Signed)
Lab called for add-on of d-dimer

## 2016-11-03 NOTE — ED Notes (Signed)
Patient transported to Ultrasound 

## 2016-11-03 NOTE — ED Notes (Signed)
ED Provider at bedside. 

## 2016-11-03 NOTE — ED Provider Notes (Signed)
[facility] Provider Note   CSN: [id number] Arrival date & time: 11/03/16  0603     History   Chief Complaint Chief Complaint  Patient presents with  . Abdominal Pain    HPI CLAYBON KOCIK is a 60 y.o. male.  The history is provided by the patient.  Abdominal Pain   This is a new problem. The current episode started 6 to 12 hours ago. The problem occurs constantly. The problem has been gradually worsening. The pain is located in the epigastric region. The quality of the pain is burning. The pain is at a severity of 8/10. Associated symptoms include nausea. Pertinent negatives include fever. The symptoms are aggravated by palpation. Nothing relieves the symptoms.  Patient with previous h/o PE presents with abd pain and back pain He reports he had rapid onset of upper back pain that appears to move into upper abdomen/epigastric region.  He reports epigastric burning.   No fever/vomiting but he reports nausea.  He has had some chest discomfort as well.  He had otherwise been well except for a recent "flu" illness that caused diarrhea.  He states he has known cholelithiasis from previous CT imaging but he has never had these symptoms before  Past Medical History:  Diagnosis Date  . Allergic rhinitis   . Arthritis   . Childhood asthma   . Closed head injury with concussion    multiple times secondary to sports/rock climbing  . Foot drop, right   . Gallstones   . GERD (gastroesophageal reflux disease)   . Numbness and tingling    right leg   . Pneumonia    last episode 4 to 5 years ago   . Pulmonary embolism ([place])    right lung  . Rupture of right triceps tendon   . Solitary pulmonary nodule 02/21/13   CT-PA performed 02/21/13. Enlarging on repeat CT scan 02/26/14. PET performed 02/28/14.   Marland Kitchen Staph infection    right knee   . Tinnitus   . Vertigo     Patient Active Problem List   Diagnosis Date Noted  . 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 S/P knee replacement  12/18/2015  . Pulmonary embolism ([place]) 02/21/2013  . Pulmonary nodule 02/21/2013  . Mediastinal lymphadenopathy 02/21/2013    Past Surgical History:  Procedure Laterality Date  . amputation great right toe     . ANKLE SURGERY    . BACK SURGERY    . EYE SURGERY     LASIK 1996   . KNEE SURGERY     right knee scoped 1992; I&D 6 times;   . left ring finger      secondary to tendon tear   . PICC LINE PLACE PERIPHERAL ([place] HX)     history of in 1992 secondary to staph infection   . TOTAL KNEE ARTHROPLASTY Right 12/18/2015   Procedure: RIGHT TOTAL KNEE ARTHROPLASTY;  Surgeon: Paralee Cancel, MD;  Location: [facility];  Service: Orthopedics;  Laterality: Right;  . TRICEPS TENDON REPAIR Right 04/08/2016   Procedure: RIGHT TRICEPS REPAIR BURSECTOMY ;  Surgeon: Roseanne Kaufman, MD;  Location: [facility];  Service: Orthopedics;  Laterality: Right;  . ULNAR NERVE TRANSPOSITION Right 04/08/2016   Procedure: ULNAR NERVE DECOMPRESSION;  Surgeon: Roseanne Kaufman, MD;  Location: [facility];  Service: Orthopedics;  Laterality: Right;       Home Medications    Prior to Admission medications   Medication Sig Start Date End Date Taking? Authorizing Provider  loratadine (CLARITIN) 10  MG tablet Take 10 mg by mouth daily as needed for allergies (only during allergy season).     Historical Provider, MD  methocarbamol (ROBAXIN) 500 MG tablet Take 1 tablet (500 mg total) by mouth 4 (four) times daily. 04/08/16   Roseanne Kaufman, MD  oxyCODONE (OXY IR/ROXICODONE) 5 MG immediate release tablet Take 2 tablets (10 mg total) by mouth every 4 (four) hours as needed for severe pain. 04/08/16   Roseanne Kaufman, MD  sulfamethoxazole-trimethoprim (BACTRIM) 400-80 MG tablet Take 1 tablet by mouth 2 (two) times daily. 04/08/16   Roseanne Kaufman, MD    Family History Family History  Problem Relation Age of Onset  . Hypertension Mother   . Sjogren's syndrome Mother   . Celiac disease Sister     Social History Social History    Substance Use Topics  . Smoking status: Never Smoker  . Smokeless tobacco: Former Systems developer    Types: Chew    Quit date: 12/19/1974  . Alcohol use No     Allergies   Peanut-containing drug products; Mustard [allyl isothiocyanate]; and Hydrocodone   Review of Systems Review of Systems  Constitutional: Negative for fever.  Cardiovascular: Positive for chest pain.  Gastrointestinal: Positive for abdominal pain and nausea.  Neurological: Negative for weakness and numbness.  All other systems reviewed and are negative.    Physical Exam Updated Vital Signs BP 144/97 (BP Location: Left Arm)   Pulse 62   Temp 97.8 F (36.6 C) (Oral)   Resp 22   Ht 5\' 11"  (1.803 m)   Wt 81.6 kg   SpO2 100%   BMI 25.10 kg/m   Physical Exam CONSTITUTIONAL: Well developed/well nourished, he is uncomfortable appearing HEAD: Normocephalic/atraumatic EYES: EOMI/PERRL ENMT: Mucous membranes moist NECK: supple no meningeal signs SPINE/BACK:entire spine nontender CV: S1/S2 noted, no murmurs/rubs/gallops noted LUNGS: Lungs are clear to auscultation bilaterally, no apparent distress ABDOMEN: soft, moderate epigastric tenderness, no rebound or guarding, bowel sounds noted throughout abdomen GU:no cva tenderness NEURO: Pt is awake/alert/appropriate, moves all extremitiesx4.  No facial droop.   EXTREMITIES: pulses normal/equal, full ROM SKIN: warm, color normal PSYCH: no abnormalities of mood noted, alert and oriented to situation   ED Treatments / Results  Labs (all labs ordered are listed, but only abnormal results are displayed) Labs Reviewed  COMPREHENSIVE METABOLIC PANEL - Abnormal; Notable for the following:       Result Value   Glucose, Bld 102 (*)    ALT 86 (*)    All other components within normal limits  CBC WITH DIFFERENTIAL/PLATELET - Abnormal; Notable for the following:    MCHC 36.5 (*)    All other components within normal limits  URINALYSIS, ROUTINE W REFLEX MICROSCOPIC (NOT AT  [facility])  LIPASE, BLOOD  TROPONIN I    EKG  EKG Interpretation  Date/Time:  Thursday November 03 2016 06:24:40 EST Ventricular Rate:  50 PR Interval:    QRS Duration: 80 QT Interval:  458 QTC Calculation: 418 R Axis:   64 Text Interpretation:  Sinus rhythm Interpretation limited secondary to artifact Otherwise no significant change Confirmed by Christy Gentles  MD, Tene Gato ([id number]) on 11/03/2016 6:27:51 AM       Radiology No results found.  Procedures Procedures (including critical care time)  Medications Ordered in ED Medications  fentaNYL (SUBLIMAZE) injection 100 mcg (not administered)  ondansetron (ZOFRAN) injection 4 mg (not administered)     Initial Impression / Assessment and Plan / ED Course  I have reviewed the triage vital signs and  the nursing notes.  Pertinent labs  results that were available during my care of the patient were reviewed by me and considered in my medical decision making (see chart for details).  Clinical Course     6:28 AM Pt presents with back pain and epigastric pain.  He reports intensity of pain is similar to prior PE, but he did not have abdominal pain previously He has focal EPIgastric tenderness.  He appears uncomfortable and this could certainly represent biliary colic.  Will proceed with labs and abdominal US imaging.  Pt denies pleuritic CP 6:56 AM Plan at signout to dr Oleta Mouse is to f/u on US imaging.  If no signs of cholecystitis or if symptoms progress, he may need further evaluation Final Clinical Impressions(s) / ED Diagnoses   Final diagnoses:  Epigastric pain    New Prescriptions New Prescriptions   No medications on file     Ripley Fraise, MD 11/03/16 [phone]

## 2017-05-19 ENCOUNTER — Encounter (HOSPITAL_BASED_OUTPATIENT_CLINIC_OR_DEPARTMENT_OTHER): Payer: Self-pay | Admitting: Emergency Medicine

## 2017-05-19 ENCOUNTER — Emergency Department (HOSPITAL_BASED_OUTPATIENT_CLINIC_OR_DEPARTMENT_OTHER)
Admission: EM | Admit: 2017-05-19 | Discharge: 2017-05-19 | Disposition: A | Discharge status: Home / Self Care | Payer: 59 | Attending: Emergency Medicine | Admitting: Emergency Medicine

## 2017-05-19 DIAGNOSIS — Z96651 Presence of right artificial knee joint: Secondary | ICD-10-CM | POA: Insufficient documentation

## 2017-05-19 DIAGNOSIS — T7840XA Allergy, unspecified, initial encounter: Secondary | ICD-10-CM

## 2017-05-19 DIAGNOSIS — Z87891 Personal history of nicotine dependence: Secondary | ICD-10-CM | POA: Diagnosis not present

## 2017-05-19 DIAGNOSIS — J45909 Unspecified asthma, uncomplicated: Secondary | ICD-10-CM | POA: Insufficient documentation

## 2017-05-19 DIAGNOSIS — T7805XA Anaphylactic reaction due to tree nuts and seeds, initial encounter: Secondary | ICD-10-CM | POA: Diagnosis not present

## 2017-05-19 DIAGNOSIS — T782XXA Anaphylactic shock, unspecified, initial encounter: Secondary | ICD-10-CM

## 2017-05-19 MED ORDER — DIPHENHYDRAMINE HCL 50 MG/ML IJ SOLN
INTRAMUSCULAR | Status: AC
  Administered 2017-05-19: 50 mg via INTRAVENOUS
  Filled 2017-05-19: qty 1

## 2017-05-19 MED ORDER — FAMOTIDINE IN NACL 20-0.9 MG/50ML-% IV SOLN
INTRAVENOUS | Status: AC
  Filled 2017-05-19: qty 50

## 2017-05-19 MED ORDER — DIPHENHYDRAMINE HCL 50 MG/ML IJ SOLN
INTRAMUSCULAR | Status: AC
  Filled 2017-05-19: qty 1

## 2017-05-19 MED ORDER — EPINEPHRINE PF 1 MG/ML IJ SOLN
INTRAMUSCULAR | Status: AC
  Administered 2017-05-19: 0.3 mg
  Filled 2017-05-19: qty 1

## 2017-05-19 MED ORDER — METHYLPREDNISOLONE SODIUM SUCC 125 MG IJ SOLR
INTRAMUSCULAR | Status: AC
  Administered 2017-05-19: 125 mg
  Filled 2017-05-19: qty 2

## 2017-05-19 MED ORDER — EPINEPHRINE 0.3 MG/0.3ML IJ SOAJ: 0.3000 mg | Freq: Once | INTRAMUSCULAR | 0 refills | Status: AC

## 2017-05-19 MED ORDER — FAMOTIDINE IN NACL 20-0.9 MG/50ML-% IV SOLN
INTRAVENOUS | Status: AC
  Administered 2017-05-19: 20 mg
  Filled 2017-05-19: qty 50

## 2017-05-19 MED ORDER — DIPHENHYDRAMINE HCL 50 MG/ML IJ SOLN
25.0000 mg | Freq: Once | INTRAMUSCULAR | Status: AC
  Administered 2017-05-19: 50 mg via INTRAVENOUS

## 2017-05-19 MED ORDER — EPINEPHRINE 0.3 MG/0.3ML IJ SOAJ
INTRAMUSCULAR | Status: AC
  Filled 2017-05-19: qty 0.3

## 2017-05-19 MED ORDER — EPINEPHRINE 0.3 MG/0.3ML IJ SOAJ
0.3000 mg | Freq: Once | INTRAMUSCULAR | Status: AC
  Administered 2017-05-19: 0.3 mg via INTRAMUSCULAR

## 2017-05-19 MED ORDER — DIPHENHYDRAMINE HCL 25 MG PO TABS: 25.0000 mg | ORAL_TABLET | Freq: Four times a day (QID) | ORAL | 0 refills | Status: DC

## 2017-05-19 MED ORDER — ALBUTEROL SULFATE (2.5 MG/3ML) 0.083% IN NEBU
2.5000 mg | INHALATION_SOLUTION | Freq: Once | RESPIRATORY_TRACT | Status: AC
  Administered 2017-05-19: 2.5 mg via RESPIRATORY_TRACT

## 2017-05-19 MED ORDER — EPINEPHRINE PF 1 MG/10ML IJ SOSY
0.3000 mg | PREFILLED_SYRINGE | Freq: Once | INTRAMUSCULAR | Status: DC
  Filled 2017-05-19: qty 10

## 2017-05-19 MED ORDER — ONDANSETRON HCL 4 MG/2ML IJ SOLN
4.0000 mg | Freq: Once | INTRAMUSCULAR | Status: AC
  Administered 2017-05-19: 4 mg via INTRAVENOUS

## 2017-05-19 MED ORDER — ONDANSETRON HCL 4 MG/2ML IJ SOLN
INTRAMUSCULAR | Status: AC
  Filled 2017-05-19: qty 2

## 2017-05-19 MED ORDER — ALBUTEROL SULFATE (2.5 MG/3ML) 0.083% IN NEBU
INHALATION_SOLUTION | RESPIRATORY_TRACT | Status: AC
  Administered 2017-05-19: 2.5 mg via RESPIRATORY_TRACT
  Filled 2017-05-19: qty 6

## 2017-05-19 MED ORDER — PREDNISONE 50 MG PO TABS: 50.0000 mg | ORAL_TABLET | Freq: Every day | ORAL | 0 refills | Status: DC

## 2017-05-19 MED ORDER — FAMOTIDINE 40 MG PO TABS: 40.0000 mg | ORAL_TABLET | Freq: Every day | ORAL | 0 refills | Status: DC

## 2017-05-19 NOTE — Discharge Instructions (Signed)
Medications: Prednisone, Pepcid, Benadryl  Treatment: Take prednisone once daily for 5 days. Take Pepcid once daily for 5 days. Take Benadryl every 6 hours for 5 days starting tomorrow.  Follow-up: Please return to emergency department if you develop any new or worsening symptoms. Please follow-up with your doctor next week as needed for any persistent symptoms.

## 2017-05-19 NOTE — ED Notes (Signed)
Pt c/o itching at this time. Throat tightness and chest tightness resolved.

## 2017-05-19 NOTE — ED Provider Notes (Signed)
[facility] Provider Note   CSN: [id number] Arrival date & time: 05/19/17  1758     History   Chief Complaint Chief Complaint  Patient presents with  . Allergic Reaction    HPI Jeffrey Adkins is a 61 y.o. male with history of severe nut allergy who presents in respiratory distress after accidentally eating cashews. Patient ate a yogurt with cashews in it about 30 minutes prior to arrival. He did not realize that the event had nuts in it. The patient almost immediately began having shortness of breath, wheezing, feeling of his throat closing, diffuse itching, flushing, and abdominal cramping and nausea. Patient took 100 mg of oral Benadryl at home prior to arrival. No vomiting. Patient given epinephrine on my history, and began having chest pain right after. No chest pain prior to before. He describes chest pain bilaterally to lower chest. Patient states his son is on a trip and currently has his EpiPen right now.  HPI  Past Medical History:  Diagnosis Date  . Allergic rhinitis   . Arthritis   . Childhood asthma   . Closed head injury with concussion    multiple times secondary to sports/rock climbing  . Foot drop, right   . Gallstones   . GERD (gastroesophageal reflux disease)   . Numbness and tingling    right leg   . Pneumonia    last episode 4 to 5 years ago   . Pulmonary embolism ([place])    right lung  . Rupture of right triceps tendon   . Solitary pulmonary nodule 02/21/13   CT-PA performed 02/21/13. Enlarging on repeat CT scan 02/26/14. PET performed 02/28/14.   Marland Kitchen Staph infection    right knee   . Tinnitus   . Vertigo     Patient Active Problem List   Diagnosis Date Noted  . 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 S/P knee replacement 12/18/2015  . Pulmonary embolism ([place]) 02/21/2013  . Pulmonary nodule 02/21/2013  . Mediastinal lymphadenopathy 02/21/2013    Past Surgical History:  Procedure Laterality Date  . amputation great right toe     . ANKLE SURGERY      . BACK SURGERY    . EYE SURGERY     LASIK 1996   . KNEE SURGERY     right knee scoped 1992; I&D 6 times;   . left ring finger      secondary to tendon tear   . PICC LINE PLACE PERIPHERAL ([place] HX)     history of in 1992 secondary to staph infection   . TOTAL KNEE ARTHROPLASTY Right 12/18/2015   Procedure: RIGHT TOTAL KNEE ARTHROPLASTY;  Surgeon: Paralee Cancel, MD;  [facility];  Service: Orthopedics;  Laterality: Right;  . TRICEPS TENDON REPAIR Right 04/08/2016   Procedure: RIGHT TRICEPS REPAIR BURSECTOMY ;  Surgeon: Roseanne Kaufman, MD;  Location: [facility];  Service: Orthopedics;  Laterality: Right;  . ULNAR NERVE TRANSPOSITION Right 04/08/2016   Procedure: ULNAR NERVE DECOMPRESSION;  Surgeon: Roseanne Kaufman, MD;  Location: [facility];  Service: Orthopedics;  Laterality: Right;       Home Medications    Prior to Admission medications   Medication Sig Start Date End Date Taking? Authorizing Provider  diphenhydrAMINE (BENADRYL) 25 MG tablet Take 1 tablet (25 mg total) by mouth every 6 (six) hours. 05/19/17   Neysha Criado, Bea Graff, PA-C  famotidine (PEPCID) 40 MG tablet Take 1 tablet (40 mg total) by mouth daily. 05/19/17   Frederica Kuster, PA-C  predniSONE (DELTASONE) 50 MG tablet Take 1 tablet (50 mg total) by mouth daily. 05/19/17   Frederica Kuster, PA-C    Family History Family History  Problem Relation Age of Onset  . Hypertension Mother   . Sjogren's syndrome Mother   . Celiac disease Sister     Social History Social History  Substance Use Topics  . Smoking status: Never Smoker  . Smokeless tobacco: Former Systems developer    Types: Chew    Quit date: 12/19/1974  . Alcohol use No     Allergies   Peanut-containing drug products; Mustard [allyl isothiocyanate]; and Hydrocodone   Review of Systems Review of Systems  Constitutional: Negative for chills and fever.  HENT: Positive for trouble swallowing. Negative for facial swelling.   Respiratory: Positive for cough and shortness of  breath.   Cardiovascular: Positive for chest pain.  Gastrointestinal: Positive for abdominal pain and nausea. Negative for vomiting.  Genitourinary: Negative for dysuria.  Musculoskeletal: Negative for back pain.  Skin: Negative for rash and wound.  Neurological: Negative for headaches.  Psychiatric/Behavioral: The patient is nervous/anxious.      Physical Exam Updated Vital Signs BP 118/72 (BP Location: Right Arm)   Pulse 67   Resp 16   SpO2 98%   Physical Exam  Constitutional: He appears well-developed and well-nourished. No distress.  HENT:  Head: Normocephalic and atraumatic.  Mouth/Throat: Oropharynx is clear and moist. No oropharyngeal exudate.  No objective swelling of the lips, throat, tongue  Eyes: Conjunctivae are normal. Pupils are equal, round, and reactive to light. Right eye exhibits no discharge. Left eye exhibits no discharge. No scleral icterus.  Neck: Normal range of motion. Neck supple. No thyromegaly present.  Cardiovascular: Normal rate, regular rhythm, normal heart sounds and intact distal pulses.  Exam reveals no gallop and no friction rub.   No murmur heard. Pulmonary/Chest: Effort normal. No stridor. No respiratory distress. He has wheezes (expiratory wheezing). He has no rales.  Abdominal: Soft. Bowel sounds are normal. He exhibits no distension. There is no tenderness. There is no rebound and no guarding.  Musculoskeletal: He exhibits no edema.  Lymphadenopathy:    He has no cervical adenopathy.  Neurological: He is alert. Coordination normal.  Skin: Skin is warm. No rash noted. He is diaphoretic. No pallor.  Diffuse mild erythema, blanching  Psychiatric: He has a normal mood and affect.  Nursing note and vitals reviewed.    ED Treatments / Results  Labs (all labs ordered are listed, but only abnormal results are displayed) Labs Reviewed - No data to display  EKG  EKG Interpretation None       Radiology No results  found.  Procedures Procedures (including critical care time)  Medications Ordered in ED Medications  famotidine (PEPCID) 20-0.9 MG/50ML-% IVPB (not administered)  diphenhydrAMINE (BENADRYL) 50 MG/ML injection (not administered)  diphenhydrAMINE (BENADRYL) 50 MG/ML injection (not administered)  EPINEPHrine (EPI-PEN) injection 0.3 mg (0.3 mg Intramuscular Given 05/19/17 1803)  methylPREDNISolone sodium succinate (SOLU-MEDROL) 125 mg/2 mL injection (125 mg  Given 05/19/17 1806)  famotidine (PEPCID) 20-0.9 MG/50ML-% IVPB (  Stopped 05/19/17 2243)  ondansetron (ZOFRAN) injection 4 mg (4 mg Intravenous Given 05/19/17 1807)  diphenhydrAMINE (BENADRYL) injection 25 mg (50 mg Intravenous Given 05/19/17 1816)  albuterol (PROVENTIL) (2.5 MG/3ML) 0.083% nebulizer solution 2.5 mg (2.5 mg Nebulization Given 05/19/17 1809)  EPINEPHrine (ADRENALIN) 1 MG/ML (0.3 mg  Given 05/19/17 1815)     Initial Impression / Assessment and Plan / ED Course  I have  reviewed the triage vital signs and the nursing notes.  Pertinent labs & imaging results that were available during my care of the patient were reviewed by me and considered in my medical decision making (see chart for details).     6:15pm Patient given Solu-Medrol 125 mg, autoinjector epinephrine, Subcutaneous epinephrine, 25 mg IV Benadryl, Zofran 4 mg, Pepcid 20 mg almost immediately after arrival. Patient also given albuterol nebulizer. Patient symptoms beginning to improve. Will reevaluate.  Patient feeling much better after above medications, however is continuing to feel itchy. Additional 25 mg IV Benadryl given.   Patient observed for 4 hours following administration of epinephrine and above medications. Patient is feeling back to baseline. We will discharge patient home with 5 day burst of prednisone, Pepcid, Benadryl, and refill of EpiPen. Follow-up to PCP as needed. Strict return precautions given. Patient understands and agrees with plan. Patient vitals  stable and discharged in satisfactory condition. Patient also evaluated by Dr. Johnney Killian who guided the patient's management and agrees with plan.  Final Clinical Impressions(s) / ED Diagnoses   Final diagnoses:  Allergic reaction, initial encounter  Anaphylaxis, initial encounter    New Prescriptions Discharge Medication List as of 05/19/2017 10:57 PM    START taking these medications   Details  diphenhydrAMINE (BENADRYL) 25 MG tablet Take 1 tablet (25 mg total) by mouth every 6 (six) hours., Starting Fri 05/19/2017, Print    EPINEPHrine (EPIPEN 2-PAK) 0.3 mg/0.3 mL IJ SOAJ injection Inject 0.3 mLs (0.3 mg total) into the muscle once., Starting Fri 05/19/2017, Print    famotidine (PEPCID) 40 MG tablet Take 1 tablet (40 mg total) by mouth daily., Starting Fri 05/19/2017, Print    predniSONE (DELTASONE) 50 MG tablet Take 1 tablet (50 mg total) by mouth daily., Starting Fri 05/19/2017, Print         Tylisa Alcivar, [place], PA-C 05/20/17 0100    Charlesetta Shanks, MD 05/26/17 [phone]

## 2017-05-19 NOTE — ED Triage Notes (Signed)
Pt presents in respiratory distress, with nut allergy. Pt took benadryl PTA. Pt moved to room 5, EDP to bedside.

## 2017-06-13 ENCOUNTER — Other Ambulatory Visit: Payer: Self-pay | Admitting: Internal Medicine

## 2017-06-13 DIAGNOSIS — R918 Other nonspecific abnormal finding of lung field: Secondary | ICD-10-CM

## 2017-06-26 ENCOUNTER — Ambulatory Visit
Admission: RE | Admit: 2017-06-26 | Discharge: 2017-06-26 | Disposition: A | Discharge status: Home / Self Care | Payer: 59 | Source: Ambulatory Visit | Attending: Internal Medicine | Admitting: Internal Medicine

## 2017-06-26 DIAGNOSIS — R918 Other nonspecific abnormal finding of lung field: Secondary | ICD-10-CM

## 2017-06-29 ENCOUNTER — Other Ambulatory Visit (HOSPITAL_COMMUNITY): Payer: Self-pay | Admitting: Thoracic Surgery

## 2017-06-29 DIAGNOSIS — R918 Other nonspecific abnormal finding of lung field: Secondary | ICD-10-CM

## 2017-07-12 ENCOUNTER — Ambulatory Visit (HOSPITAL_COMMUNITY)
Admission: RE | Admit: 2017-07-12 | Discharge: 2017-07-12 | Disposition: A | Discharge status: Home / Self Care | Payer: 59 | Source: Ambulatory Visit | Attending: Thoracic Surgery | Admitting: Thoracic Surgery

## 2017-07-12 DIAGNOSIS — R599 Enlarged lymph nodes, unspecified: Secondary | ICD-10-CM | POA: Diagnosis not present

## 2017-07-12 DIAGNOSIS — R938 Abnormal findings on diagnostic imaging of other specified body structures: Secondary | ICD-10-CM | POA: Diagnosis not present

## 2017-07-12 DIAGNOSIS — R918 Other nonspecific abnormal finding of lung field: Secondary | ICD-10-CM | POA: Insufficient documentation

## 2017-07-12 LAB — GLUCOSE, CAPILLARY: GLUCOSE-CAPILLARY: 81 mg/dL (ref 65–99)

## 2017-07-12 MED ORDER — FLUDEOXYGLUCOSE F - 18 (FDG) INJECTION
10.1000 | Freq: Once | INTRAVENOUS | Status: AC | PRN
  Administered 2017-07-12: 10.1 via INTRAVENOUS

## 2017-07-13 ENCOUNTER — Encounter: Payer: Self-pay | Admitting: Thoracic Surgery (Cardiothoracic Vascular Surgery)

## 2017-07-13 ENCOUNTER — Ambulatory Visit (INDEPENDENT_AMBULATORY_CARE_PROVIDER_SITE_OTHER): Payer: 59 | Admitting: Thoracic Surgery (Cardiothoracic Vascular Surgery)

## 2017-07-13 VITALS — BP 127/78 | HR 68 | Resp 16 | Ht 71.0 in | Wt 185.0 lb

## 2017-07-13 DIAGNOSIS — R918 Other nonspecific abnormal finding of lung field: Secondary | ICD-10-CM

## 2017-07-13 NOTE — Progress Notes (Signed)
PCP is Josetta Huddle, MD Referring Provider is Josetta Huddle, MD  Chief Complaint  Patient presents with  . Lung Lesion    Surgical eval, PET Scan 07/12/17, Chest CT 06/26/17    HPI: 61 year old gentleman who returns for consultation regarding lung nodules and mediastinal adenopathy.  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 He does have a history of asthma. Back in 2014 he had a pulmonary embolus after a trip to [place]. On his CT angiogram he was found to have a 6 mm right upper lobe nodule. He also had some mildly enlarged hilar mediastinal lymph nodes. He had a repeat CT done in March 2015 which showed an increase in the size of the right upper lobe nodule there was also a left lower lobe nodule. A PET CT showed the right upper lobe nodule is mildly hypermetabolic. He had significantly hypermetabolic lymph nodes. He had an endobronchial ultrasound in [place]. It was not definitively diagnostic but there was a suggestion of granulomatous disease.  I saw him in consultation and we had planned to proceed with mediastinal endoscopy followed by right VATS and wedge resection. He opted not to have surgery. He had some follow-up scans but I have not seen him again until today.  He recently had a repeat CT since it had been about 2 years since his last one. It showed no significant change in the size of the hilar or mediastinal nodes. However, the left lower lobe and right upper lobe nodules of both increased in size. A PET CT was done in follow-up which showed of these areas were hypermetabolic.   [name] works out intensely on a regular basis. He has noticed some increased shortness of breath at max exertion relative to where he was about a year ago. He has not had any change in appetite or weight loss. He denies fevers, chills, night sweats.  Zubrod Score: At the time of surgery this patient's most appropriate activity status/level should be described as: [x]     0     Normal activity, no symptoms []     1    Restricted in physical strenuous activity but ambulatory, able to do out light work []     2    Ambulatory and capable of self care, unable to do work activities, up and about >50 % of waking hours                              []     3    Only limited self care, in bed greater than 50% of waking hours []     4    Completely disabled, no self care, confined to bed or chair []     5    Moribund  Past Medical History:  Diagnosis Date  . Allergic rhinitis   . Arthritis   . Childhood asthma   . Closed head injury with concussion    multiple times secondary to sports/rock climbing  . Foot drop, right   . Gallstones   . GERD (gastroesophageal reflux disease)   . Numbness and tingling    right leg   . Pneumonia    last episode 4 to 5 years ago   . Pulmonary embolism ([place])    right lung  . Rupture of right triceps tendon   . Solitary pulmonary nodule 02/21/13   CT-PA performed 02/21/13. Enlarging on repeat CT scan 02/26/14. PET performed 02/28/14.   Marland Kitchen  Staph infection    right knee   . Tinnitus   . Vertigo     Past Surgical History:  Procedure Laterality Date  . amputation great right toe     . ANKLE SURGERY    . BACK SURGERY    . EYE SURGERY     LASIK 1996   . KNEE SURGERY     right knee scoped 1992; I&D 6 times;   . left ring finger      secondary to tendon tear   . PICC LINE PLACE PERIPHERAL ([place] [facility]X)     history of in 1992 secondary to staph infection   . TOTAL KNEE ARTHROPLASTY Right 12/18/2015   Procedure: RIGHT TOTAL KNEE ARTHROPLASTY;  Surgeon: Paralee Cancel, MD;  Location: [facility];  Service: Orthopedics;  Laterality: Right;  . TRICEPS TENDON REPAIR Right 04/08/2016   Procedure: RIGHT TRICEPS REPAIR BURSECTOMY ;  Surgeon: Roseanne Kaufman, MD;  Location: [facility];  Service: Orthopedics;  Laterality: Right;  . ULNAR NERVE TRANSPOSITION Right 04/08/2016   Procedure: ULNAR NERVE DECOMPRESSION;  Surgeon: Roseanne Kaufman, MD;  Location: [place];  Service:  Orthopedics;  Laterality: Right;    Family History  Problem Relation Age of Onset  . Hypertension Mother   . Sjogren's syndrome Mother   . Celiac disease Sister     Social History Social History  Substance Use Topics  . Smoking status: Never Smoker  . Smokeless tobacco: Former Systems developer    Types: Chew    Quit date: 12/19/1974  . Alcohol use No    Current Outpatient Prescriptions  Medication Sig Dispense Refill  . EPINEPHrine 0.3 mg/0.3 mL IJ SOAJ injection Inject into the muscle once.     No current facility-administered medications for this visit.     Allergies  Allergen Reactions  . Peanut-Containing Drug Products Anaphylaxis    All nuts  . Madelaine Bhat Isothiocyanate] Other (See Comments)    other  . Hydrocodone Itching    Pt clarified only itching, NOT hives    Review of Systems  Constitutional: Positive for fatigue. Negative for activity change.  HENT: Negative for trouble swallowing and voice change.   Eyes: Positive for visual disturbance.  Respiratory: Positive for shortness of breath (with extreme activity) and wheezing.   Cardiovascular: Positive for chest pain (with extreme exertion).  Gastrointestinal: Positive for abdominal pain (reflux).  Musculoskeletal: Positive for arthralgias and back pain.  Skin: Positive for rash.  All other systems reviewed and are negative.   BP 127/78   Pulse 68   Resp 16   Ht 5\' 11"  (1.803 m)   Wt 185 lb (83.9 kg)   SpO2 96% Comment: RA  BMI 25.80 kg/m  Physical Exam  Constitutional: He is oriented to person, place, and time. He appears well-developed and well-nourished. No distress.  HENT:  Head: Normocephalic and atraumatic.  Mouth/Throat: No oropharyngeal exudate.  Eyes: Pupils are equal, round, and reactive to light. Conjunctivae and EOM are normal. No scleral icterus.  Neck: Neck supple. No thyromegaly present.  Cardiovascular: Normal rate, regular rhythm, normal heart sounds and intact distal pulses.  Exam  reveals no gallop and no friction rub.   No murmur heard. Pulmonary/Chest: Effort normal and breath sounds normal. No respiratory distress. He has no wheezes. He has no rales.  Abdominal: Soft. He exhibits no distension. There is no tenderness.  Musculoskeletal: He exhibits no edema.  Lymphadenopathy:    He has no cervical adenopathy.  Neurological: He is alert and oriented to  person, place, and time. No cranial nerve deficit.  No focal motor deficit  Skin: Skin is warm and dry.  Vitals reviewed.    Diagnostic Tests: CT CHEST WITHOUT CONTRAST  TECHNIQUE: Multidetector CT imaging of the chest was performed following the standard protocol without IV contrast.  COMPARISON:  CT scan of March 30, 2015.  FINDINGS: Cardiovascular: No significant vascular findings. Normal heart size. No pericardial effusion.  Mediastinum/Nodes: Stable mediastinal lymph nodes are noted compared to prior exam, at least 1 of which is calcified. All lower less than 1 cm in minor axis.  Lungs/Pleura: No pneumothorax or pleural effusion is noted. Spiculated density measuring 13 x 11 mm is noted laterally in right upper lobe with pleural tail which is significantly enlarged compared to prior exam and consistent with malignancy, best seen on image number 44 series 4. 4 mm nodule is noted slightly more inferiorly and anteriorly which may represent satellite nodule. 9 x 8 mm nodule is noted laterally in left lung base, best seen on image number 100 of series 4. This is enlarged compared to prior exam and demonstrates pleural tail, also consistent with malignancy.  Upper Abdomen: Cholelithiasis is noted.  Musculoskeletal: No chest wall mass or suspicious bone lesions identified.  IMPRESSION: 13 mm spiculated density seen in right upper lobe which is significantly increased in size compared to prior exam and consistent with malignancy. Also noted is 9 mm nodule laterally in left lung base with  pleural tail, which is also enlarged compared to prior exam and concerning for malignancy. PET scan is recommended for further evaluation.  These results will be called to the ordering clinician or representative by the Radiologist Assistant, and communication documented in the PACS or zVision Dashboard.   Electronically Signed   By: Marijo Conception, M.D.   On: 06/26/2017 09:11 NUCLEAR MEDICINE PET SKULL BASE TO THIGH  TECHNIQUE: 10.1 mCi F-18 FDG was injected intravenously. Full-ring PET imaging was performed from the skull base to thigh after the radiotracer. CT data was obtained and used for attenuation correction and anatomic localization.  FASTING BLOOD GLUCOSE:  Value: 81 mg/dl  COMPARISON:  PET-CT March 06, 2014. CT scans of the chest March 30, 2015 and June 26, 2017.  FINDINGS: NECK  There is uptake in the left side of the neck as seen on image 23. I suspect this correlates with a lymph node although CT imaging is limited without contrast. The maximum SUV is measured on image 23 is 5. A few smaller nodes in the base of the neck demonstrate low level uptake such as on image 42 on the right. This node is similar in the interval.  CHEST  Several FDG avid pulmonary nodules are identified. The irregular nodule on series 4, image 58 in the lateral right apex measures 13 by 10 mm today versus 9 x 8 mm in 2015. This nodule demonstrates a maximum SUV of 6.5 today versus 2.1 on the previous study. A more intense nodule is seen on series 4, image 61, not seen previously. There is mild uptake in the posterior right chest on image 85 with no CT correlate. The growing nodule in the left lung base on series 4, image 88 measures 8 mm today versus 6 mm previously with low level uptake and a maximum SUV of 2.4 today and no abnormal uptake in this region previously. Increased uptake in the left suprahilar region on image 59 probably correlates with a tiny nodule  between several vessels which is difficult  to see. No other FDG avid nodules are noted. FDG avid mediastinal adenopathy is again identified. One of these nodes, on series 4, image 60, is partially calcified which is a new finding. Overall, the nodes are similar in size on CT imaging. None of the nodes appear to be larger and a few are actually smaller. The subcarinal node on image 70 measures 10 mm today versus 11 mm previously with a maximum SUV of 8.3 today versus 8.8 previously. A left hilar node demonstrates a maximum SUV of 10 today versus 5.2 previously. This node is difficult to measure without contrast. There may be a few other small nodes which demonstrate increased FDG uptake with a left hilar node is the best example.  ABDOMEN/PELVIS  Low level uptake is seen in the liver on image 91 with no CT correlate. The maximum SUV is 4.1 compared to liver background of 3.1. Uptake in the abdomen on image 102 could correlate with caudate lobe versus an adjacent small lymph node. I suspect it is most likely associated with a small adjacent node. There is increased uptake in this region today with a maximum SUV of 4.9. There is an FDG avid right external iliac lymph node which measures 14 by 9 mm today versus 13 by 7 mm in 2015. This node is now FDG avid with a maximum SUV of 6.5. No other abnormal uptake in the abdomen or pelvis.  SKELETON  No abnormal bony uptake identified.  IMPRESSION: 1. This is a complicated study. The patient had FDG avid nodes in March of 2015. Biopsy suggested infection or an inflammatory process such as sarcoidosis. Many of the nodes in the base of neck, mediastinum, and hila are similar in size and level of uptake today. At least 1 of the nodes is now partially calcified. There are however a few nodes in the neck, mediastinum, and left hilum which are more FDG avid in the interval. Given previous biopsy results, the findings may represent  progression of sarcoidosis. However, based on imaging, it would be difficult to exclude a malignant process such as lymphoma or metastatic disease. 2. Increasing pulmonary nodularity. The left-sided nodules demonstrate low level FDG uptake and the largest in the left lower lobe has only mildly increased in size since March of 2015. The right upper lobe nodularity described above is more significantly worsened with significant increased FDG uptake on today's study compared to previous studies. Again, it would be difficult to exclude multifocal primary malignancy, lymphoma or metastatic disease based on imaging. However, given the possible history of sarcoidosis based on previous biopsy, the nodularity in the lungs could be secondary to an inflammatory process such as sarcoidosis. Recommend biopsy of the nodule in the right upper lobe on series 4, image 58 for clarification. 3. There is a lymph node in the right side of the pelvis which is slightly larger in the 3 year interval but now demonstrates significantly greater FDG uptake. I suspect the uptake in the region of the caudate is most likely due to an adjacent lymph node. The nodes in the abdomen and pelvis are not specific and could represent malignancy or an inflammatory process. Given the possible history of sarcoidosis, inflammatory reactive node should considered. 4. There is mild uptake within the liver described above without CT correlate. This could be artifactual. The patient may benefit from a contrast-enhanced CT of the abdomen and pelvis to exclude an underlying liver lesion which would be more difficult to explain with sarcoidosis. 5. No other  abnormalities identified.   Electronically Signed   By: Dorise Bullion III M.D   On: 07/12/2017 16:39 I personally reviewed the CT and PET CT images and compared them to the ones from 2015. I concur with the findings noted above.  Impression: [name] is a 61 year old  gentleman with bilateral lung nodules as well as hilar or mediastinal adenopathy. These lesions are all hypermetabolic on PET/CT. The hilar and mediastinal lymph nodes have not changed significantly in size over the years but the lung nodules have increased in size. He had an endobronchial ultrasound in 2015 which was suspicious for granulomatous disease in the nodes. He was never treated for sarcoidosis. That was his preference.   Now 3 years later, the nodes are stable in size but his lung nodules have increased in size slightly. He is a nonsmoker, which does not completely rule out the possibility that the lung nodules or cancerous, but does make it less likely.  My suspicion is that this is all sarcoidosis. I suspect if one of these nodules cancer would've grown significantly more over that timeframe given the degree of hypermetabolism present on PET. I do think it would be worthwhile to biopsy at least one of the lesions to try to establish a more definitive diagnosis.  We discussed 3 possible options. One would be to do a navigational bronchoscopy. That would allow [place] to sample both sides, but would still leave [place] with relatively small sample sizes and it may be difficult to establish a definitive diagnosis of granulomatous disease. 2 would be a CT-guided biopsy. A core likely would give enough tissue to make a definitive diagnosis of granulomatous disease, but there is a risk of pneumothorax. The final option would be right VATS for wedge resection to make a definitive diagnosis, although that involves a major surgical procedure with its attendant risks. 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  At this point, 3 years down the road from initially finding these abnormalities, I would favor attempting a CT-guided biopsy of the right upper lobe nodule. If that does not give [place] a definitive diagnosis then we could always proceed with a VATS and wedge resection  afterwards.  He wishes to think about these options. We will schedule him for follow-up in a week. If he decides in the meantime he would like to go ahead and schedule CT-guided biopsy we will do so and then I will meet with him afterwards.  His CT also showed some activity noted caudate lobe of the liver. Radiology recommended a contrast-enhanced CT. I will order that while we are sorting out the best approach.  Plan:  Will order CT of abdomen and pelvis with IV contrast as recommended by radiology to further evaluate the liver  If he decides to proceed with a CT-guided biopsy, he will call the office and we will schedule that.  I'll plan to see him again next week to further discuss our options.  Melrose Nakayama, MD [facility] [phone]

## 2017-07-14 ENCOUNTER — Other Ambulatory Visit: Payer: Self-pay | Admitting: *Deleted

## 2017-07-14 ENCOUNTER — Institutional Professional Consult (permissible substitution): Payer: 59 | Admitting: Emergency Medicine

## 2017-07-14 DIAGNOSIS — K769 Liver disease, unspecified: Secondary | ICD-10-CM

## 2017-07-20 ENCOUNTER — Other Ambulatory Visit: Payer: 59

## 2017-07-20 ENCOUNTER — Ambulatory Visit: Payer: 59 | Admitting: Thoracic Surgery (Cardiothoracic Vascular Surgery)

## 2017-08-23 ENCOUNTER — Encounter: Payer: Self-pay | Admitting: Pulmonary Disease

## 2017-08-23 ENCOUNTER — Other Ambulatory Visit: Payer: 59

## 2017-08-23 ENCOUNTER — Ambulatory Visit (INDEPENDENT_AMBULATORY_CARE_PROVIDER_SITE_OTHER): Payer: 59 | Admitting: Pulmonary Disease

## 2017-08-23 VITALS — BP 132/84 | HR 67 | Ht 71.0 in | Wt 188.4 lb

## 2017-08-23 DIAGNOSIS — D869 Sarcoidosis, unspecified: Secondary | ICD-10-CM

## 2017-08-23 DIAGNOSIS — R590 Localized enlarged lymph nodes: Secondary | ICD-10-CM

## 2017-08-23 MED ORDER — PREDNISONE 5 MG PO TABS: 5.0000 mg | ORAL_TABLET | Freq: Every day | ORAL | 1 refills | Status: DC

## 2017-08-23 NOTE — Progress Notes (Signed)
Subjective:    Patient ID: Jeffrey Adkins, male    DOB: 08-16-1956, 61 y.o.   MRN: [id number]  HPI  Chief Complaint  Patient presents with  . pulm consult    Pt referred by Dr. Roselie Awkward. Pt had lung nodules in 2015, lympnoids inflamed. This year the nodules grown in size Jul 26, 2017. Pt has paralized vocal cord.    61 year old never smoker, Atty. at law presents for evaluation of right upper lobe nodule and based on lymphadenopathy post biopsy. He has a history of childhood asthma and unprovoked pulmonary embolism in 2014 after a trip to [place] for which he took Xarelto for 4 months He was found to have a 59mm RUL nodule on CT scan when the PE was dx 02/2013 He had repeat CT scan 02/26/14, PET 02/28/14 that showed the nodule to be increased in size, slightly hypermetabolic on PET, also hypermetabolic mediastinal nodes borderline enlarged. Since he was asymptomatic, he deferred biopsy then. CT chest 06/2017 showed right upper lobe nodule increased in size to 13 mm and 9 mm left lower lobe nodule which was also increased compared to prior imaging, these were both hypermetabolic on PET. PET scan also showed hypermetabolic subcarinal and hilar lymph nodes has also an external iliac, may be also a lymph node along the caudate lobe of liver. At least one of the mediastinal left breast appeared to be calcified in the interim  He underwent CT-guided needle biopsy of right upper lobe nodule on 07/24/17 and mediastinoscopy on 07/25/17 at [facility] by Dr. Baldemar Friday. I have reviewed biopsy results and physician notes from care everywhere. 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 DNA probes were negative for mycobacteria and fungus.  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  Currently, besides hoarseness his main complaint is fatigue and  occasional night sweats. He has intensive workouts and he feels that he cannot sustain his high-intensity workouts beyond a few minutes  PFTs 06/2017 show normal ratio, FEV1 of 83%, FVC of 86% and DLCO of 80% Calcium 9.9    Past Medical History:  Diagnosis Date  . Allergic rhinitis   . Arthritis   . Childhood asthma   . Closed head injury with concussion    multiple times secondary to sports/rock climbing  . Foot drop, right   . Gallstones   . GERD (gastroesophageal reflux disease)   . Numbness and tingling    right leg   . Pneumonia    last episode 4 to 5 years ago   . Pulmonary embolism ([place])    right lung  . Rupture of right triceps tendon   . Solitary pulmonary nodule 02/21/13   CT-PA performed 02/21/13. Enlarging on repeat CT scan 02/26/14. PET performed 02/28/14.   Marland Kitchen Staph infection    right knee   . Tinnitus   . Vertigo    Past Surgical History:  Procedure Laterality Date  . amputation great right toe     . ANKLE SURGERY    . BACK SURGERY    . EYE SURGERY     LASIK 1996   . KNEE SURGERY     right knee scoped 1992; I&D 6 times;   . left ring finger      secondary to tendon tear   . PICC LINE PLACE PERIPHERAL ([place] HX)     history  of in 1992 secondary to staph infection   . TOTAL KNEE ARTHROPLASTY Right 12/18/2015   Procedure: RIGHT TOTAL KNEE ARTHROPLASTY;  Surgeon: Paralee Cancel, MD;  Location: [facility];  Service: Orthopedics;  Laterality: Right;  . TRICEPS TENDON REPAIR Right 04/08/2016   Procedure: RIGHT TRICEPS REPAIR BURSECTOMY ;  Surgeon: Roseanne Kaufman, MD;  Location: [place];  Service: Orthopedics;  Laterality: Right;  . ULNAR NERVE TRANSPOSITION Right 04/08/2016   Procedure: ULNAR NERVE DECOMPRESSION;  Surgeon: Roseanne Kaufman, MD;  Location: [place];  Service: Orthopedics;  Laterality: Right;    Allergies  Allergen Reactions  . Peanut-Containing Drug Products Anaphylaxis    All nuts  . Madelaine Bhat Isothiocyanate] Other (See Comments)    other  . Hydrocodone  Itching    Pt clarified only itching, NOT hives      Social History   Social History  . Marital status: Married    Spouse name: N/A  . Number of children: N/A  . Years of education: N/A   Occupational History  . Not on file.   Social History Main Topics  . Smoking status: Never Smoker  . Smokeless tobacco: Former Systems developer    Types: Chew    Quit date: 12/19/1974  . Alcohol use No  . Drug use: No  . Sexual activity: Not on file   Other Topics Concern  . Not on file   Social History Narrative  . No narrative on file      Family History  Problem Relation Age of Onset  . Hypertension Mother   . Sjogren's syndrome Mother   . Celiac disease Sister      Review of Systems  Constitutional: Negative for fever and unexpected weight change.  HENT: Positive for dental problem. Negative for congestion, ear pain, nosebleeds, postnasal drip, rhinorrhea, sinus pressure, sneezing, sore throat and trouble swallowing.   Eyes: Negative for redness and itching.  Respiratory: Positive for cough, chest tightness and shortness of breath. Negative for wheezing.   Cardiovascular: Negative for palpitations and leg swelling.  Gastrointestinal: Negative for nausea and vomiting.  Genitourinary: Negative for dysuria.  Musculoskeletal: Negative for joint swelling.  Skin: Negative for rash.  Neurological: Positive for dizziness, weakness, light-headedness and headaches.  Hematological: Does not bruise/bleed easily.  Psychiatric/Behavioral: Negative for dysphoric mood. The patient is not nervous/anxious.        Objective:   Physical Exam   Gen. Pleasant, well-nourished, in no distress, normal affect ENT - hoarse voice, no post nasal drip Neck: No JVD, no thyromegaly, no carotid bruits Lungs: no use of accessory muscles, no dullness to percussion, clear without rales or rhonchi  Cardiovascular: Rhythm regular, heart sounds  normal, no murmurs or gallops, no peripheral edema Abdomen: soft and  non-tender, no hepatosplenomegaly, BS normal. Musculoskeletal: No deformities, no cyanosis or clubbing Neuro:  alert, non focal         Assessment & Plan:

## 2017-08-23 NOTE — Assessment & Plan Note (Signed)
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 - differential diagnosis includes sarcoidosis and nontuberculous mycobacterial-however mycobacterial DNA probes are negative . Fungal stains and cultures are negative so far. This probably leaves [place] with a diagnosis of sarcoidosis although necrotizing granulomas would be unusual. His PFTs are normal, only reason to treat would be systemic symptoms of night sweats and fatigue  Check blood work- ACE level today Trial of prednisone 5 mg daily - for your systemic symptoms x 6 weeks  We discussed side effects of steroids including effect on bones, tendons and adrenal insufficiency. On the downside would be undiagnosed infection, but with cultures on biopsy negative so far this seems to be less likely  call me in 4 weeks to report -we will then consider rapid taper of steroids to off. We'll monitor his systemic symptoms as an point and perhaps reimage him in 3 months to see if nodules has shrunk in size

## 2017-08-23 NOTE — Patient Instructions (Signed)
You have granulomatous inflammation in the lungs and lymph glands, presumably sarcoidosis  Check blood work- ACE level today Trial of prednisone 5 mg daily - for your systemic symptoms x 6 weeks  call me in 4 weeks to report

## 2017-08-24 LAB — ANGIOTENSIN CONVERTING ENZYME: ANGIOTENSIN-CONVERTING ENZYME: 19 U/L (ref 9–67)

## 2017-09-22 ENCOUNTER — Encounter: Payer: Self-pay | Admitting: Pulmonary Disease

## 2017-09-22 ENCOUNTER — Ambulatory Visit (INDEPENDENT_AMBULATORY_CARE_PROVIDER_SITE_OTHER): Payer: 59 | Admitting: Pulmonary Disease

## 2017-09-22 DIAGNOSIS — R911 Solitary pulmonary nodule: Secondary | ICD-10-CM

## 2017-09-22 DIAGNOSIS — R590 Localized enlarged lymph nodes: Secondary | ICD-10-CM

## 2017-09-22 MED ORDER — PREDNISONE 5 MG PO TABS: ORAL_TABLET | ORAL | 0 refills | Status: DC

## 2017-09-22 NOTE — Patient Instructions (Signed)
CT chest without contrast in 1 month  Increase prednisone to 20 mg daily for one week, then 10 mg daily for one week, then 5 mg daily instead the stools until next appointment  We will send prescription for 10 mg prednisone #30

## 2017-09-22 NOTE — Addendum Note (Signed)
Addended by: Parke Poisson E on: 09/22/2017 05:23 PM   Modules accepted: Orders

## 2017-09-22 NOTE — Assessment & Plan Note (Addendum)
Presumed sarcoidosis  Increase prednisone to 20 mg daily for one week, then 10 mg daily for one week, then 5 mg daily instead the stools until next appointment  We will send prescription for 10 mg prednisone #30  We'll follow subjectively symptoms of fatigue and shortness of breath and imaging. The symptoms may be related to his hoarseness and vocal cord issues rather than lungs but we will undertake therapeutic trial with steroids increased dose

## 2017-09-22 NOTE — Progress Notes (Signed)
   Subjective:    Patient ID: Jeffrey Adkins, male    DOB: [birth year], 61 y.o.   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    CT chest 06/2017 showed right upper lobe nodule increased in size to 13 mm and 9 mm left lower lobe nodule which was also increased compared to prior imaging, these were both hypermetabolic on PET. PET scan also showed hypermetabolic subcarinal and hilar lymph nodes has also an external iliac, may be also a lymph node along the caudate lobe of liver. At least one of the mediastinal lymph nodes appeared to be calcified in the interim  He underwent CT-guided needle biopsy of right upper lobe nodule on 07/24/17 and mediastinoscopy on 07/25/17 at [facility] by Dr. Baldemar Friday. 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 DNA probes were negative for mycobacteria and fungus.  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 He was started on low-dose prednisone 5 mg last visit, he is tolerated well but has been no improvement in his fatigue during his workouts and night sweats also seemed to persist   Significant tests/ events reviewed  PFTs 06/2017 show normal ratio, FEV1 of 83%, FVC of 86% and DLCO of 80% Calcium 9.9 ACE 19   Review of Systems Patient denies significant dyspnea,cough, hemoptysis,  chest pain,  palpitations, pedal edema, orthopnea, paroxysmal nocturnal dyspnea, lightheadedness, nausea, vomiting, abdominal or  leg pains      Objective:   Physical Exam  Gen. Pleasant, well-nourished, in no distress ENT - no thrush, no post nasal drip, hoarse voice Neck: No JVD, no thyromegaly, no carotid bruits Lungs: no use of accessory muscles, no dullness to percussion, clear without rales or rhonchi  Cardiovascular: Rhythm regular, heart sounds  normal, no murmurs or gallops, no peripheral edema Musculoskeletal: No deformities, no cyanosis or clubbing        Assessment & Plan:

## 2017-09-22 NOTE — Assessment & Plan Note (Signed)
CT chest without contrast in 1 month

## 2017-09-25 ENCOUNTER — Telehealth: Payer: Self-pay | Admitting: Pulmonary Disease

## 2017-09-25 NOTE — Telephone Encounter (Signed)
Spoke with Jeffrey Adkins. Patient has been scheduled for his CT scan at the [facility] location on [facility] on 10/23/17 at 2pm. Patient needs to arrive 145p. No solid foods 2 hours prior to test.   Since RA does not have any openings for next week, I will call the patient and DB him somewhere on RA's schedule.

## 2017-09-25 NOTE — Telephone Encounter (Signed)
Left a message for patient.   Address for [employer] [address] is [place] (Located in [place]) [address]rd Floor. Ph [phone]

## 2017-09-25 NOTE — Telephone Encounter (Signed)
Spoke with pt, advised message/ Pt understood and nothing further is needed. FYI for Cherina to call and make an appt with RA.

## 2017-09-25 NOTE — Telephone Encounter (Signed)
Patient returned call, [phone].

## 2017-09-26 NOTE — Telephone Encounter (Signed)
Left message for patient to call back. RA's schedule is open for next month. Does not need to be the same day as his CT scan unless he asks.

## 2017-10-17 ENCOUNTER — Other Ambulatory Visit: Payer: Self-pay | Admitting: Pulmonary Disease

## 2017-10-20 ENCOUNTER — Telehealth: Payer: Self-pay | Admitting: Pulmonary Disease

## 2017-10-20 NOTE — Telephone Encounter (Signed)
Received call from [facility] w/ CT Pt is scheduled for CT w/ contrast on Monday 11.5.18 @ 2pm but the office note says CT w/o contrast  The order was placed by my self and if I recall correctly, Dr Elsworth Soho verbally corrected the AVS stating that CT should be WITH contrast  RA is not available until Monday morning Will route message to him marked urgent and will text/page him Monday morning as well  Dr Elsworth Soho please advise, thank you

## 2017-10-23 ENCOUNTER — Ambulatory Visit (INDEPENDENT_AMBULATORY_CARE_PROVIDER_SITE_OTHER)
Admission: RE | Admit: 2017-10-23 | Discharge: 2017-10-23 | Disposition: A | Discharge status: Home / Self Care | Payer: 59 | Source: Ambulatory Visit | Attending: Pulmonary Disease | Admitting: Pulmonary Disease

## 2017-10-23 DIAGNOSIS — R590 Localized enlarged lymph nodes: Secondary | ICD-10-CM

## 2017-10-23 DIAGNOSIS — R911 Solitary pulmonary nodule: Secondary | ICD-10-CM

## 2017-10-23 MED ORDER — IOPAMIDOL (ISOVUE-300) INJECTION 61%
80.0000 mL | Freq: Once | INTRAVENOUS | Status: AC | PRN
  Administered 2017-10-23: 80 mL via INTRAVENOUS

## 2017-10-23 NOTE — Telephone Encounter (Signed)
CT chest WITH contrast please

## 2017-10-23 NOTE — Telephone Encounter (Signed)
Jeffrey Adkins with CT is aware that CT is to be with contrast.  Nothing further needed.

## 2017-10-26 ENCOUNTER — Telehealth: Payer: Self-pay | Admitting: Pulmonary Disease

## 2017-10-26 NOTE — Telephone Encounter (Signed)
Notes recorded by Rigoberto Noel, MD on 10/24/2017 at 9:45 AM EST No change in size of nodules since prior exam in 06/2017 Pl make routine FU appt  lmtcb x1 for pt.

## 2017-10-27 NOTE — Telephone Encounter (Signed)
Called and spoke with pt and he is aware of results    He made the appt with RA on 11/29

## 2017-11-16 ENCOUNTER — Encounter: Payer: Self-pay | Admitting: Pulmonary Disease

## 2017-11-16 ENCOUNTER — Ambulatory Visit: Payer: 59 | Admitting: Pulmonary Disease

## 2017-11-16 VITALS — BP 120/66 | HR 74 | Ht 71.0 in | Wt 193.8 lb

## 2017-11-16 DIAGNOSIS — R911 Solitary pulmonary nodule: Secondary | ICD-10-CM

## 2017-11-16 DIAGNOSIS — D869 Sarcoidosis, unspecified: Secondary | ICD-10-CM | POA: Diagnosis not present

## 2017-11-16 DIAGNOSIS — R590 Localized enlarged lymph nodes: Secondary | ICD-10-CM

## 2017-11-16 NOTE — Progress Notes (Signed)
   Subjective:    Patient ID: Jeffrey Adkins, male    DOB: 03-02-1956, 61 y.o.   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    CT chest 06/2017 showed right upper lobe nodule increased in size to 13 mm and 9 mm left lower lobe nodule which was also increased compared to prior imaging,these were both hypermetabolic on PET.PET scan also showed hypermetabolic subcarinal and hilar lymph nodes has also an external iliac,may be also a lymph node along the caudate lobe of liver. At least one of the mediastinal lymph nodes appeared to be calcified in the interim  He underwent CT-guided needle biopsy of right upper lobe nodule on 07/24/17 and mediastinoscopy on 07/25/17 at [facility] by Dr. Baldemar Friday.Pathology showed necrotizing and nonnecrotizing granulomas ,fungal stains and cultures negative .DNA probes were negative for mycobacteria, bacteria  and fungus.  His hoarseness is improved.  He has self tapered himself off of prednisone, relates to a maximum of 20 mg and he did not feel any improvement in his shortness of breath. He still complains of mild dyspnea especially during his workouts-he feels like he did when he had a pneumonia.  He denies wheezing or sputum production. He has used albuterol inhalers in the past and does not feel like this helped him  We reviewed his repeat CT from 10/2017 which shows stable nodules including the right upper lobe 13 mm and 8 mm mediastinal lymph nodes all  stable  Spirometry shows drop in lung function with FEV1 of 59% FVC of 59% a ratio of 76 suggesting moderate restriction   Significant tests/ events reviewed  PFTs 06/2017 show normal ratio, FEV1 of 83%, FVC of 86% and DLCO of 80% Calcium 9.9 ACE 19   Review of Systems Patient denies significant dyspnea,cough, hemoptysis,  chest pain, palpitations, pedal edema, orthopnea, paroxysmal nocturnal dyspnea, lightheadedness, nausea, vomiting, abdominal or  leg pains      Objective:   Physical Exam   Gen. Pleasant, well-nourished, in no distress ENT - no thrush, no post nasal drip, voice improved Neck: No JVD, no thyromegaly, no carotid bruits Lungs: no use of accessory muscles, no dullness to percussion, clear without rales or rhonchi  Cardiovascular: Rhythm regular, heart sounds  normal, no murmurs or gallops, no peripheral edema Musculoskeletal: No deformities, no cyanosis or clubbing         Assessment & Plan:

## 2017-11-16 NOTE — Assessment & Plan Note (Signed)
Stable FU CT in 95mnths

## 2017-11-16 NOTE — Patient Instructions (Addendum)
CT scan shows stable nodules and lymphadenopathy Lung function has dropped  Okay to stop prednisone.  Repeat CT chest with contrast in 6 months

## 2017-11-16 NOTE — Assessment & Plan Note (Addendum)
CT scan shows stable nodules and lymphadenopathy Lung function has dropped ? Cause - restrictive pattern but scarring no worse on CT - to attribute to sarcoid  Okay to stop prednisone.  Repeat CT chest with contrast in 6 months

## 2018-05-16 ENCOUNTER — Telehealth: Payer: Self-pay

## 2018-05-16 ENCOUNTER — Ambulatory Visit (INDEPENDENT_AMBULATORY_CARE_PROVIDER_SITE_OTHER)
Admission: RE | Admit: 2018-05-16 | Discharge: 2018-05-16 | Disposition: A | Discharge status: Home / Self Care | Payer: 59 | Source: Ambulatory Visit | Attending: Pulmonary Disease | Admitting: Pulmonary Disease

## 2018-05-16 DIAGNOSIS — R911 Solitary pulmonary nodule: Secondary | ICD-10-CM

## 2018-05-16 MED ORDER — IOPAMIDOL (ISOVUE-300) INJECTION 61%
100.0000 mL | Freq: Once | INTRAVENOUS | Status: AC | PRN
  Administered 2018-05-16: 100 mL via INTRAVENOUS

## 2018-05-16 NOTE — Telephone Encounter (Signed)
Call report Dr Elsworth Soho  IMPRESSION: 1. New nodular opacity left lower lobe measuring 2.5 x 2.1 cm. There is mild adjacent soft tissue stranding. It is possible that this area represents focal pneumonitis. Given its nodular appearance, however, neoplasm must be of concern. It may be prudent to consider nuclear medicine PET study given this change from prior study.  2. Marginally larger nodular opacity in the anterior segment right lower lobe as noted above. Again, correlation with PET-CT advised to assess this area. Other nodular opacities appear stable compared to prior study.  3. No demonstrable adenopathy by size criteria. There are multiple subcentimeter lymph nodes appreciable on this study.  4.  Evidence of a degree of gynecomastia.  These results will be called to the ordering clinician or representative by the Radiologist Assistant, and communication documented in the PACS or zVision Dashboard.

## 2018-05-16 NOTE — Telephone Encounter (Signed)
Please make appointment on 6/3 at 9:30 AM to discuss (Please cancel appointment on current patient in this slot since she has already seen me to discuss san results )

## 2018-05-17 NOTE — Telephone Encounter (Signed)
Patient has been scheduled for 6/3 at 930.

## 2018-05-21 ENCOUNTER — Encounter: Payer: Self-pay | Admitting: Pulmonary Disease

## 2018-05-21 ENCOUNTER — Ambulatory Visit: Payer: 59 | Admitting: Pulmonary Disease

## 2018-05-21 DIAGNOSIS — D869 Sarcoidosis, unspecified: Secondary | ICD-10-CM

## 2018-05-21 MED ORDER — PREDNISONE 10 MG PO TABS: 10.0000 mg | ORAL_TABLET | Freq: Every day | ORAL | 1 refills | Status: DC

## 2018-05-21 NOTE — Assessment & Plan Note (Signed)
We will start prednisone 10 mg tablets once daily with food-we will treat as flare up with a longer course of prednisone this time since short course did not seem to help previously   We discussed alternatives to prednisone. We discussed long-term side effects of prednisone. Start taking calcium supplements for bone health.  Call me back to touch base in 4 weeks and to report symptoms.  CT chest with contrast and PFTs in September before follow-up

## 2018-05-21 NOTE — Patient Instructions (Signed)
We will start prednisone 10 mg tablets once daily with food We discussed alternatives to prednisone. We discussed long-term side effects of prednisone. Start taking calcium supplements for bone health.  Call me back to touch base in 4 weeks and to report symptoms.  CT chest with contrast and PFTs in September before follow-up

## 2018-05-21 NOTE — Progress Notes (Signed)
Subjective:    Patient ID: Jeffrey Adkins, male    DOB: 12-18-1956, 62 y.o.   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    CT chest 06/2017 showed right upper lobe nodule increased in size to 13 mm and 9 mm left lower lobe nodule which was also increased compared to prior imaging,these were both hypermetabolic on PET.PET scan also showed hypermetabolic subcarinal and hilar lymph nodes has also an external iliac,may be also a lymph node along the caudate lobe of liver. At least one of the mediastinallymph nodesappeared to be calcified in the interim  He underwent CT-guided needle biopsy of right upper lobe nodule on 07/24/17 and mediastinoscopy on 07/25/17 at [facility] by Dr. Baldemar Friday.Pathology showed necrotizing and nonnecrotizing granulomas ,fungal stains and cultures negative .DNA probes were negative for mycobacteria, bacteria  and fungus.  Interim history -  he has been able to resume his workouts.  Reports occasional night sweats.  No skin lesions He is still huffing and puffing during his workouts. Hoarseness comes and goes and now attributes this to reflux, much improved currently  CT chest 04/2018 which shows new left lower lobe nodule 2.5 x 2.1 cm slight increase in right upper lobe nodule otherwise stable changes    Significant tests/ events reviewed  CT chest  10/2017 which shows stable nodules including the right upper lobe 13 mm and 8 mm mediastinal lymph nodes  all stable  Spirometry 10/2017  drop in lung function with FEV1 of 59% FVC of 59% a ratio of 76 suggesting moderate restriction  PFTs 06/2017 show normal ratio, FEV1 of 83%, FVC of 86% and DLCO of 80% Calcium 9.9 ACE 19  Past Medical History:  Diagnosis Date  . Allergic rhinitis   . Arthritis   . Childhood asthma   . Closed head injury with concussion    multiple times secondary to sports/rock climbing  . Foot drop, right   . Gallstones   . GERD (gastroesophageal reflux disease)   . Numbness and tingling    right leg   . Pneumonia    last episode 4 to 5 years ago   . Pulmonary embolism ([place])    right lung  . Rupture of right triceps tendon   . Solitary pulmonary nodule 02/21/13   CT-PA performed 02/21/13. Enlarging on repeat CT scan 02/26/14. PET performed 02/28/14.   Marland Kitchen Staph infection    right knee   . Tinnitus   . Vertigo      Review of Systems neg for any significant sore throat, dysphagia, itching, sneezing, nasal congestion or excess/ purulent secretions, fever, chills, sweats, unintended wt loss, pleuritic or exertional cp, hempoptysis, orthopnea pnd or change in chronic leg swelling.   Also denies presyncope, palpitations, heartburn, abdominal pain, nausea, vomiting, diarrhea or change in bowel or urinary habits, dysuria,hematuria, rash, arthralgias, visual complaints, headache, numbness weakness or ataxia.     Objective:   Physical Exam  Gen. Pleasant, well-nourished, in no distress ENT - no thrush, no post  nasal drip Neck: No JVD, no thyromegaly, no carotid bruits Lungs: no use of accessory muscles, no dullness to percussion, clear without rales or rhonchi  Cardiovascular: Rhythm regular, heart sounds  normal, no murmurs or gallops, no peripheral edema Musculoskeletal: No deformities, no cyanosis or clubbing        Assessment & Plan:

## 2018-05-21 NOTE — Assessment & Plan Note (Signed)
New left lower lobe nodule/patchy infiltrate

## 2018-08-21 ENCOUNTER — Ambulatory Visit (INDEPENDENT_AMBULATORY_CARE_PROVIDER_SITE_OTHER)
Admission: RE | Admit: 2018-08-21 | Discharge: 2018-08-21 | Disposition: A | Discharge status: Home / Self Care | Payer: 59 | Source: Ambulatory Visit | Attending: Pulmonary Disease | Admitting: Pulmonary Disease

## 2018-08-21 DIAGNOSIS — D869 Sarcoidosis, unspecified: Secondary | ICD-10-CM | POA: Diagnosis not present

## 2018-08-21 MED ORDER — IOPAMIDOL (ISOVUE-300) INJECTION 61%
80.0000 mL | Freq: Once | INTRAVENOUS | Status: AC | PRN
  Administered 2018-08-21: 80 mL via INTRAVENOUS

## 2018-08-22 ENCOUNTER — Telehealth: Payer: Self-pay | Admitting: Pulmonary Disease

## 2018-08-22 NOTE — Telephone Encounter (Signed)
Spoke with patient, made patient aware that his CT had not resulted yet but we would call him as soon as they were available. Voiced understanding.   TP please advise of CT results. Thanks.

## 2018-08-23 NOTE — Telephone Encounter (Signed)
Called spoke with patient, advised of CT results as stated by TP Appt scheduled with RA for 10.24.19 @ 1330 - appt card mailed to verified home address Patient also requesting copy of CT be faxed to his home fax machine @ [phone] - no fax cover sheet needed  Done Nothing further needed; will sign off

## 2018-08-23 NOTE — Telephone Encounter (Signed)
CT chest shows stable nodules . Cont w/ ov recs Make sure he has follow up with Dr. Elsworth Soho  In 6-8 weeks for follow up

## 2018-08-24 ENCOUNTER — Encounter: Payer: Self-pay | Admitting: *Deleted

## 2018-08-24 ENCOUNTER — Telehealth: Payer: Self-pay | Admitting: Pulmonary Disease

## 2018-08-24 NOTE — Telephone Encounter (Signed)
Spoke with pt. States that he spoke with Janett Billow yesterday about this CT results. CT report was to be faxed to him but he never received it. He asks to have this sent to him through [facility]. This has been done. I am also going to mail him a hard copy of the report. Nothing further was needed.

## 2018-10-11 ENCOUNTER — Ambulatory Visit: Payer: 59 | Admitting: Pulmonary Disease

## 2018-10-11 ENCOUNTER — Encounter: Payer: Self-pay | Admitting: Pulmonary Disease

## 2018-10-11 VITALS — BP 128/72 | HR 70 | Ht 71.0 in | Wt 185.0 lb

## 2018-10-11 DIAGNOSIS — D869 Sarcoidosis, unspecified: Secondary | ICD-10-CM | POA: Diagnosis not present

## 2018-10-11 DIAGNOSIS — R911 Solitary pulmonary nodule: Secondary | ICD-10-CM | POA: Diagnosis not present

## 2018-10-11 NOTE — Patient Instructions (Signed)
CT chest with contrast in 6 months

## 2018-10-11 NOTE — Assessment & Plan Note (Signed)
He is undergoing treatment with prednisone with hardly any benefit and hence will not treat unless symptoms much worse. Lung function appears to be maintained

## 2018-10-11 NOTE — Progress Notes (Signed)
Subjective:    Patient ID: Jeffrey Adkins, male    DOB: 08/14/1956, 62 y.o.   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    CT chest 06/2017 showed right upper lobe nodule increased in size to 13 mm and 9 mm left lower lobe nodule which was also increased compared to prior imaging,these were both hypermetabolic on PET.PET scan also showed hypermetabolic subcarinal and hilar lymph nodes has also an external iliac,may be also a lymph node along the caudate lobe of liver. At least one of the mediastinallymph nodesappeared to be calcified in the interim  He underwent CT-guided needle biopsy of right upper lobe nodule on 07/24/17 and mediastinoscopy on 07/25/17 at [facility] by Dr. Baldemar Friday.Pathology showed necrotizing and nonnecrotizing granulomas ,fungal stains and cultures negative .DNA probes were negative for mycobacteria, bacteriaand fungus.   Chief Complaint  Patient presents with  . Follow-up    review CT-pt states breathing is baseline- c/o sob with stairs, occ wheezing & mild chest tightness.   Serial imaging showed appearance of new left lower lobe 2.5 cm nodule.  Lung function appeared to have dropped slightly hence we treated him with low-dose prednisone for 2 to 3 months.  He took this and is now tapered back down. We reviewed CT chest which showed slight decrease in left lobe nodule to 2 cm but still persists, other nodules are all stable. He remains with an  active lifestyle and feels dyspneic on intense workouts but is able to carry out level sustained activity for a long period of time.  He was able to climb a 14-er in [place]  Spirometry showed lung function back to normal with ratio 77, FEV1 of 82% and FVC of 81% He has a child with an lifelong history of asthma but does not want to take allergy shots or albuterol.  He has allergies to multiple medications and that he has required epinephrine on multiple occasions multiple ER visits  Significant tests/ events reviewed  CT chest  10/2017 which shows stable nodules including the right upper lobe 13 mm and 8 mm mediastinal lymph nodes all stable  CT chest 04/2018 which shows new left lower lobe nodule 2.5 x 2.1 cm slight increase in right upper lobe nodule otherwise stable changes  CT chest 08/2018 2 cm nodule left base + 1 cm satellite nodule , RUL stable, other nodules stable  Spirometry 10/2017  drop in lung function with FEV1 of 59% FVC of 59% a ratio of 76 suggesting moderate restriction  PFTs 06/2017 show normal ratio, FEV1 of 83%, FVC of 86% and DLCO of 80% Calcium 9.9 ACE 19  Review of Systems neg for any significant sore throat, dysphagia, itching, sneezing, nasal congestion or excess/ purulent secretions, fever, chills, sweats, unintended wt loss, pleuritic or exertional cp, hempoptysis, orthopnea pnd or change in chronic leg swelling. Also denies presyncope, palpitations, heartburn, abdominal pain, nausea, vomiting, diarrhea or change in bowel or urinary habits, dysuria,hematuria, rash, arthralgias, visual complaints, headache, numbness weakness or ataxia.  Objective:   Physical Exam  Gen. Pleasant, well-nourished, in no distress ENT - no thrush, no post nasal drip Neck: No JVD, no thyromegaly, no carotid bruits Lungs: no use of accessory muscles, no dullness to percussion, clear without rales or rhonchi  Cardiovascular: Rhythm regular, heart sounds  normal, no murmurs or  gallops, no peripheral edema Musculoskeletal: No deformities, no cyanosis or clubbing         Assessment & Plan:

## 2018-10-11 NOTE — Assessment & Plan Note (Signed)
Left lower lobe nodule.  In 03/2018 compared to 10/2017 and has shrunk slightly from 2.5 to 2 cm in 08/2018.   CT chest with contrast in 6 months

## 2019-02-19 ENCOUNTER — Other Ambulatory Visit: Payer: Self-pay

## 2019-02-19 DIAGNOSIS — D869 Sarcoidosis, unspecified: Secondary | ICD-10-CM

## 2019-04-12 ENCOUNTER — Other Ambulatory Visit: Payer: 59

## 2019-04-23 ENCOUNTER — Encounter (HOSPITAL_BASED_OUTPATIENT_CLINIC_OR_DEPARTMENT_OTHER): Payer: Self-pay | Admitting: Emergency Medicine

## 2019-04-23 ENCOUNTER — Other Ambulatory Visit: Payer: Self-pay

## 2019-04-23 ENCOUNTER — Emergency Department (HOSPITAL_BASED_OUTPATIENT_CLINIC_OR_DEPARTMENT_OTHER)
Admission: EM | Admit: 2019-04-23 | Discharge: 2019-04-23 | Disposition: A | Discharge status: Home / Self Care | Payer: 59 | Attending: Emergency Medicine | Admitting: Emergency Medicine

## 2019-04-23 ENCOUNTER — Ambulatory Visit: Payer: 59 | Admitting: Pulmonary Disease

## 2019-04-23 ENCOUNTER — Emergency Department (HOSPITAL_BASED_OUTPATIENT_CLINIC_OR_DEPARTMENT_OTHER): Payer: 59

## 2019-04-23 DIAGNOSIS — Z91010 Allergy to peanuts: Secondary | ICD-10-CM | POA: Diagnosis not present

## 2019-04-23 DIAGNOSIS — Z20828 Contact with and (suspected) exposure to other viral communicable diseases: Secondary | ICD-10-CM | POA: Diagnosis not present

## 2019-04-23 DIAGNOSIS — J069 Acute upper respiratory infection, unspecified: Secondary | ICD-10-CM | POA: Insufficient documentation

## 2019-04-23 DIAGNOSIS — D869 Sarcoidosis, unspecified: Secondary | ICD-10-CM | POA: Diagnosis not present

## 2019-04-23 DIAGNOSIS — R0602 Shortness of breath: Secondary | ICD-10-CM | POA: Diagnosis present

## 2019-04-23 DIAGNOSIS — Z96651 Presence of right artificial knee joint: Secondary | ICD-10-CM | POA: Insufficient documentation

## 2019-04-23 DIAGNOSIS — J45909 Unspecified asthma, uncomplicated: Secondary | ICD-10-CM | POA: Diagnosis not present

## 2019-04-23 LAB — BASIC METABOLIC PANEL
Anion gap: 8 (ref 5–15)
BUN: 15 mg/dL (ref 6–20)
CO2: 27 mmol/L (ref 22–32)
Calcium: 9.4 mg/dL (ref 8.9–10.3)
Chloride: 102 mmol/L (ref 98–111)
Creatinine, Ser: 1 mg/dL (ref 0.61–1.24)
GFR calc Af Amer: >60 mL/min (ref >60)
GFR calc non Af Amer: >60 mL/min (ref >60)
Glucose, Bld: 97 mg/dL (ref 70–99)
Potassium: 3.3 mmol/L — ABNORMAL LOW (ref 3.5–5.1)
Sodium: 137 mmol/L (ref 135–145)

## 2019-04-23 MED ORDER — AZITHROMYCIN 250 MG PO TABS: 250.0000 mg | ORAL_TABLET | Freq: Every day | ORAL | 0 refills | Status: DC

## 2019-04-23 MED ORDER — IOHEXOL 300 MG/ML  SOLN
100.0000 mL | Freq: Once | INTRAMUSCULAR | Status: AC | PRN
  Administered 2019-04-23: 100 mL via INTRAVENOUS

## 2019-04-23 NOTE — ED Notes (Signed)
Patient transported to CT 

## 2019-04-23 NOTE — ED Notes (Signed)
Pt understood dc material. NAD noted. Script sent in electronically. All questions answered to satisfaction. Pt escorted to check out counter

## 2019-04-23 NOTE — ED Provider Notes (Signed)
[facility] Provider Note   CSN: [id number] Arrival date & time: 04/23/19  1924    History   Chief Complaint Chief Complaint  Patient presents with   Cough   Generalized Body Aches    HPI Jeffrey Adkins is a 63 y.o. male.     HPI Patient presents with shortness of breath and cough.  Has been feeling bad for the last few days.  Hot and cold.  Some fatigue.  No sick contacts.  Has a history of sarcoid.  Some runny nose.  Frequent coughs.  Scheduled for CT scan to follow with sarcoid and lung nodules later this month.  States he is still been able to be active. Past Medical History:  Diagnosis Date   Allergic rhinitis    Arthritis    Childhood asthma    Closed head injury with concussion    multiple times secondary to sports/rock climbing   Foot drop, right    Gallstones    GERD (gastroesophageal reflux disease)    Numbness and tingling    right leg    Pneumonia    last episode 4 to 5 years ago    Pulmonary embolism (HCC)    right lung   Rupture of right triceps tendon    Solitary pulmonary nodule 02/21/13   CT-PA performed 02/21/13. Enlarging on repeat CT scan 02/26/14. PET performed 02/28/14.    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   S/P knee replacement 12/18/2015   Pulmonary embolism ([place]) 02/21/2013   Pulmonary nodule 02/21/2013   Sarcoidosis 02/21/2013    Past Surgical History:  Procedure Laterality Date   amputation great right toe      ANKLE SURGERY     BACK SURGERY     EYE SURGERY     LASIK 1996    KNEE SURGERY     right knee scoped 1992; I&D 6 times;    left ring finger      secondary to tendon tear    PICC LINE PLACE PERIPHERAL ([place] [facility])     history of in 1992 secondary to staph infection    TOTAL KNEE ARTHROPLASTY Right 12/18/2015   Procedure: RIGHT TOTAL KNEE ARTHROPLASTY;  Surgeon:  Paralee Cancel, MD;  Location: [facility];  Service: Orthopedics;  Laterality: Right;   TRICEPS TENDON REPAIR Right 04/08/2016   Procedure: RIGHT TRICEPS REPAIR BURSECTOMY ;  Surgeon: Roseanne Kaufman, MD;  Location: [place];  Service: Orthopedics;  Laterality: Right;   ULNAR NERVE TRANSPOSITION Right 04/08/2016   Procedure: ULNAR NERVE DECOMPRESSION;  Surgeon: Roseanne Kaufman, MD;  Location: [place];  Service: Orthopedics;  Laterality: Right;        Home Medications    Prior to Admission medications   Medication Sig Start Date End Date Taking? Authorizing Provider  azithromycin (ZITHROMAX) 250 MG tablet Take 1 tablet (250 mg total) by mouth daily. Take first 2 tablets together, then 1 every day until finished. 04/23/19   Davonna Belling, MD  EPINEPHrine 0.3 mg/0.3 mL IJ SOAJ injection Inject into the muscle once.    [provider]  famotidine (PEPCID) 10 MG tablet Take 10 mg by mouth daily as needed.     [provider]  ibuprofen (ADVIL,MOTRIN) 600 MG tablet Take 600 mg by mouth every 8 (eight) hours as needed.    [provider]    Family History Family History  Problem Relation Age of Onset   Hypertension Mother    Sjogren's syndrome Mother    Celiac disease Sister     Social History Social History   Tobacco Use   Smoking status: Never Smoker   Smokeless tobacco: Former Systems developer    Types: Chew  Substance Use Topics   Alcohol use: No   Drug use: No     Allergies   Peanut-containing drug products; Mustard [allyl isothiocyanate]; and Hydrocodone   Review of Systems Review of Systems  Constitutional: Positive for chills. Negative for appetite change.  HENT: Positive for congestion.   Respiratory: Positive for cough and shortness of breath. Negative for choking.   Cardiovascular: Negative for chest pain.  Gastrointestinal: Negative for abdominal pain.  Genitourinary: Negative for flank pain.  Musculoskeletal: Negative for back pain.  Skin:  Negative for rash.  Neurological: Negative for weakness and numbness.     Physical Exam Updated Vital Signs BP 139/77    Pulse 73    Temp 98.1 F (36.7 C) (Oral)    Resp 18    Ht 6' (1.829 m)    Wt 80.7 kg    SpO2 100%    BMI 24.14 kg/m   Physical Exam Vitals signs and nursing note reviewed.  HENT:     Head: Atraumatic.     Mouth/Throat:     Mouth: Mucous membranes are moist.  Cardiovascular:     Rate and Rhythm: Regular rhythm.  Pulmonary:     Comments: Mildly harsh breath sounds without focal rales or rhonchi. Abdominal:     Tenderness: There is no abdominal tenderness.  Musculoskeletal:     Right lower leg: No edema.     Left lower leg: No edema.  Skin:    General: Skin is warm.     Capillary Refill: Capillary refill takes less than 2 seconds.     Coloration: Skin is not jaundiced.  Neurological:     Mental Status: He is alert. Mental status is at baseline.      ED Treatments / Results  Labs (all labs ordered are listed, but only abnormal results are displayed) Labs Reviewed  BASIC METABOLIC PANEL - Abnormal; Notable for the following components:      Result Value   Potassium 3.3 (*)    All other components within normal limits  NOVEL CORONAVIRUS, NAA (HOSPITAL ORDER, SEND-OUT TO REF LAB)    EKG None  Radiology Ct Chest W Contrast  Result Date: 04/23/2019 CLINICAL DATA:  Cough.  Fatigue.  Body aches. EXAM: CT CHEST WITH CONTRAST TECHNIQUE: Multidetector CT imaging of the chest was performed during intravenous contrast administration. CONTRAST:  130mL OMNIPAQUE IOHEXOL 300 MG/ML  SOLN COMPARISON:  CT chest dated 08/21/2018. FINDINGS: Cardiovascular: No significant vascular findings. Normal heart size. No pericardial effusion. 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 This appears chronic but slightly worsened when compared to prior study. Mediastinum/Nodes: Multiple small but prominent mediastinal and  hilar lymph nodes are again noted. There is no significant axillary adenopathy. There are a few prominent right supraclavicular lymph nodes. Lungs/Pleura: Again identified are scattered pulmonary nodules bilaterally. Some of these pulmonary nodules have increased in size since the prior study. For example there is a right upper lobe pulmonary nodule measuring approximately 1.5 cm on today's exam (axial series 2, image 52). This nodule previously measured approximately 1.3 cm. There are multiple additional scattered pulmonary  nodules throughout the right upper lobe, many of which appear enlarged compared to prior study. The right middle lobe and right lower lobe are relatively spared. There are a few small pulmonary nodules in the left upper lobe. The left lower lobe pulmonary nodules are stable to slightly increased in size. There is no pneumothorax. No large pleural effusion. The central airways are patent. The centrally located left lower lobe nodular opacity has increased slightly from prior study. Upper Abdomen: Multiple gallstones are noted. Otherwise, the upper abdomen is unremarkable. Musculoskeletal: Is worsening asymmetric gynecomastia, right worse than left. No acute or significant osseous findings. IMPRESSION: 1. Slight interval increase in several pulmonary nodules in the right upper lobe. Again these can be seen in patients with sarcoidosis and have only slightly increased since 2018. Interval decrease in size of multiple left lower lobe pulmonary nodules when compared to CT dated 08/21/2018. 2. Developing asymmetric gynecomastia, right worse than left. This has slowly progressed since 2018, however the soft tissue in the right subareolar region has significantly increased since 2019. Correlation with physical exam is recommended. Consideration should be given to dedicated mammography for further evaluation. 3. Cholelithiasis without definite CT evidence of acute cholecystitis. Electronically Signed    By: Constance Holster M.D.   On: 04/23/2019 20:43    Procedures Procedures (including critical care time)  Medications Ordered in ED Medications  iohexol (OMNIPAQUE) 300 MG/ML solution 100 mL (100 mLs Intravenous Contrast Given 04/23/19 2016)     Initial Impression / Assessment and Plan / ED Course  I have reviewed the triage vital signs and the nursing notes.  Pertinent labs & imaging results that were available during my care of the patient were reviewed by me and considered in my medical decision making (see chart for details).        Patient with URI symptoms.  History of sarcoid.  X-ray reassuring.  However with comorbidities with sarcoid COVID testing was done.  Follow-up as an outpatient.  Discharge home with antibiotics due to the sarcoid.  Jeffrey Adkins was evaluated in Emergency Department on 04/23/2019 for the symptoms described in the history of present illness. He was evaluated in the context of the global COVID-19 pandemic, which necessitated consideration that the patient might be at risk for infection with the SARS-CoV-2 virus that causes COVID-19. Institutional protocols and algorithms that pertain to the evaluation of patients at risk for COVID-19 are in a state of rapid change based on information released by regulatory bodies including the CDC and federal and state organizations. These policies and algorithms were followed during the patient's care in the ED.  Final Clinical Impressions(s) / ED Diagnoses   Final diagnoses:  Upper respiratory tract infection, unspecified type  Sarcoidosis    ED Discharge Orders         Ordered    azithromycin (ZITHROMAX) 250 MG tablet  Daily     04/23/19 2144           Davonna Belling, MD 04/23/19 2336

## 2019-04-23 NOTE — ED Notes (Signed)
ED Provider at bedside. 

## 2019-04-23 NOTE — ED Triage Notes (Signed)
Cough got worse last night and overall feeling crappy. States feeling of fatigue, body aches, and feeling "hot and cold". Endorses runny nose and some sneezing. Has been taking clartin-d.

## 2019-04-25 LAB — NOVEL CORONAVIRUS, NAA (HOSP ORDER, SEND-OUT TO REF LAB; TAT 18-24 HRS): SARS-CoV-2, NAA: NOT DETECTED

## 2019-05-15 ENCOUNTER — Inpatient Hospital Stay: Admission: RE | Admit: 2019-05-15 | Payer: 59 | Source: Ambulatory Visit

## 2019-05-20 ENCOUNTER — Ambulatory Visit: Payer: 59 | Admitting: Pulmonary Disease

## 2019-05-20 ENCOUNTER — Other Ambulatory Visit: Payer: Self-pay

## 2019-05-20 ENCOUNTER — Encounter: Payer: Self-pay | Admitting: Pulmonary Disease

## 2019-05-20 DIAGNOSIS — R911 Solitary pulmonary nodule: Secondary | ICD-10-CM

## 2019-05-20 DIAGNOSIS — R918 Other nonspecific abnormal finding of lung field: Secondary | ICD-10-CM | POA: Diagnosis not present

## 2019-05-20 NOTE — Progress Notes (Signed)
Subjective:    Patient ID: Jeffrey Adkins, male    DOB: [birth year], 63 y.o.   MRN: [id number]  HPI   63 yo never smoker, AttorneyFor follow-up ofright upper lobe nodule and Mediastinallymphadenopathy-likely sarcoidosis, He has a history of childhood asthma and unprovoked pulmonary embolism in 2014 after a trip to [place] for which he took Xarelto for 4 months   COURSE :  He was found to have a 38mm RUL nodule on CT scan when the PE was dx 02/2013 He had repeat CT scan 02/26/14, PET 02/28/14 that showed the nodule to be increased in size, slightly hypermetabolic on PET, alsohypermetabolic mediastinal nodes borderlineenlarged.    CT chest 06/2017 showed right upper lobe nodule increased in size to 13 mm and 9 mm left lower lobe nodule which was also increased compared to prior imaging,these were both hypermetabolic on PET.PET scan also showed hypermetabolic subcarinal and hilar lymph nodes has also an external iliac,may be also a lymph node along the caudate lobe of liver. At least one of the mediastinallymph nodesappeared to be calcified in the interim  He underwent CT-guided needle biopsy of right upper lobe nodule on 07/24/17 and mediastinoscopy on 07/25/17 at [facility] by Dr. Baldemar Friday.Pathology showed necrotizing and nonnecrotizing granulomas ,fungal stains and cultures negative .DNA probes were negative for mycobacteria, bacteriaand fungus.   Serial imaging showed appearance of new left lower lobe 2.5 cm nodule in 2019 which then decreased in size.  Lung function also decreased hence we treated him with low-dose prednisone for 2 to 3 months without symptomatic benefit, this has since recurred  He had an ER visit 04/23/2023 URI symptoms and underwent [place] testing which was negative. We reviewed CT chest from 04/23/2019 which showed slight increase in nodules in the right upper lobe, left lower lobe nodules were stable. There was increasing gynecomastia on the right-he reports  that the subcutaneous nodule in the right breast area has been present for more than 15 years and has waxed and waned over the years he had a mammogram in the remote past which was negative.  I reviewed his prior PET scans which show mild hypermetabolism in the subcutaneous mass in 2015 but none in 2018   He has a child with an lifelong history of asthma but does not want to take allergy shots or albuterol.  He has allergies to multiple medications and that he has required epinephrine on multiple occasions multiple ER visits  Significant tests/ events reviewed  CTchest11/2018 which shows stable nodules including the right upper lobe 13 mm and 8 mm mediastinal lymph nodes all stable  CT chest 04/2018 which shows new left lower lobe nodule 2.5 x 2.1 cm slight increase in right upper lobe nodule otherwise stable changes  CT chest 08/2018 2 cm nodule left base + 1 cm satellite nodule , RUL stable, other nodules stable   Spirometry  09/2018 lung function back to normal with ratio 77, FEV1 of 82% and FVC of 81%  Spirometry11/2018drop in lung function with FEV1 of 59% FVC of 59% a ratio of 76 suggesting moderate restriction  PFTs 06/2017 show normal ratio, FEV1 of 83%, FVC of 86% and DLCO of 80% Calcium 9.9 ACE 19  Review of Systems Patient denies significant dyspnea,cough, hemoptysis,  chest pain, palpitations, pedal edema, orthopnea, paroxysmal nocturnal dyspnea, lightheadedness, nausea, vomiting, abdominal or  leg pains      Objective:   Physical Exam   Gen. Pleasant, well-nourished, in no distress ENT - no thrush, no pallor/icterus,no  post nasal drip Neck: No JVD, no thyromegaly, no carotid bruits Lungs: no use of accessory muscles, no dullness to percussion, clear without rales or rhonchi  Cardiovascular: Rhythm regular, heart sounds  normal, no murmurs or gallops, no peripheral edema Musculoskeletal: No deformities, no cyanosis or clubbing         Assessment & Plan:

## 2019-05-20 NOTE — Addendum Note (Signed)
Addended by: Georjean Mode on: 05/20/2019 10:45 AM   Modules accepted: Orders

## 2019-05-20 NOTE — Assessment & Plan Note (Signed)
This has been a stable interval for Jeffrey Adkins.  The cause of his multiple pulmonary nodules still unclear, we have attributed this to sarcoidosis but course of prednisone did not seem to help him at all  He has had some systemic involvement with an external iliac lymph node and may be another lymph node in the liver.  The subcutaneous nodule on his right breast area seems to have been present for many years and has also waxed and waned. Overall his lung function has recovered after a slight drop in 2018 We will continue to follow him from an imaging standpoint.  We will do a PET scan in 1 year

## 2019-05-20 NOTE — Patient Instructions (Signed)
Follow-up PET scan in 1 year

## 2019-10-15 ENCOUNTER — Encounter (INDEPENDENT_AMBULATORY_CARE_PROVIDER_SITE_OTHER): Payer: Self-pay

## 2019-10-22 IMAGING — CT CT CHEST WITH CONTRAST
2 of 3 series · 15 of 36 positions shown, 18 images · IV contrast (omnipaque)
Comparison: CT chest dated 08/21/2018.

CLINICAL DATA: Cough.  Fatigue.  Body aches.

EXAM:
CT CHEST WITH CONTRAST
TECHNIQUE: Multidetector CT imaging of the chest was performed during
intravenous contrast administration.
CONTRAST:  100mL OMNIPAQUE IOHEXOL 300 MG/ML  SOLN

[Series 2: axial st · axial · 0.74mm/px · z∈[-135,+141]mm · 12 of 163 slices shown, 15 images]
[im 13/163  mediastinal]
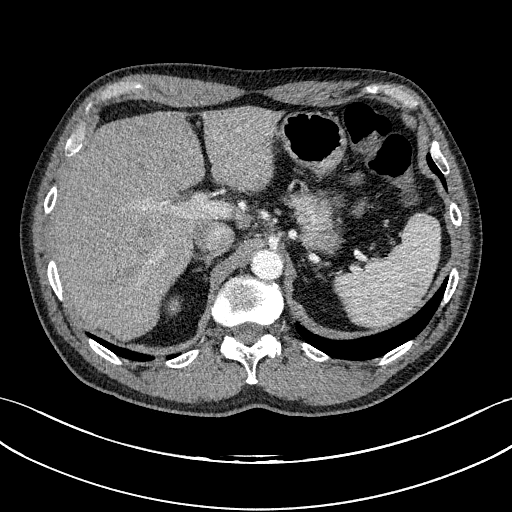
[im 13/163  lung]
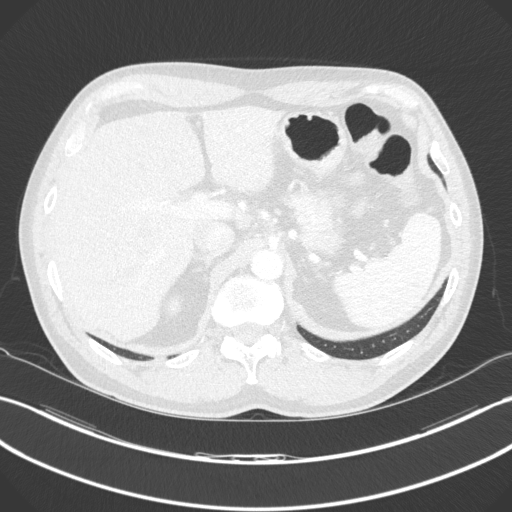
[im 25/163  lung]
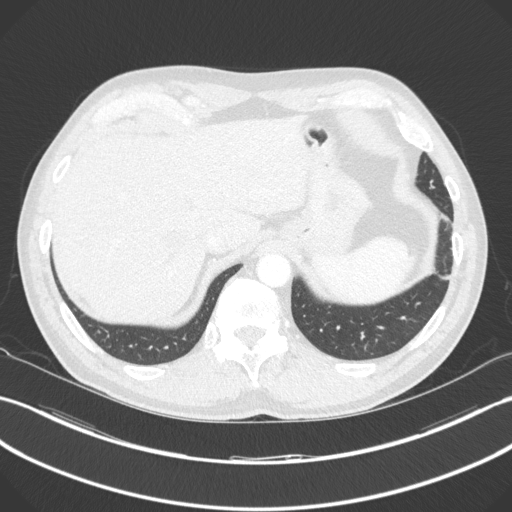
[im 37/163  lung]
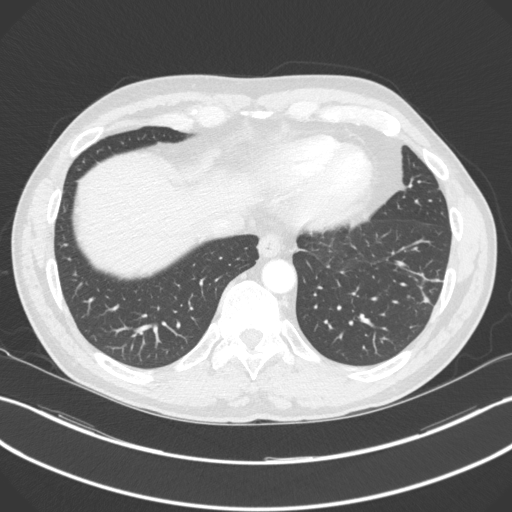
[im 49/163  lung]
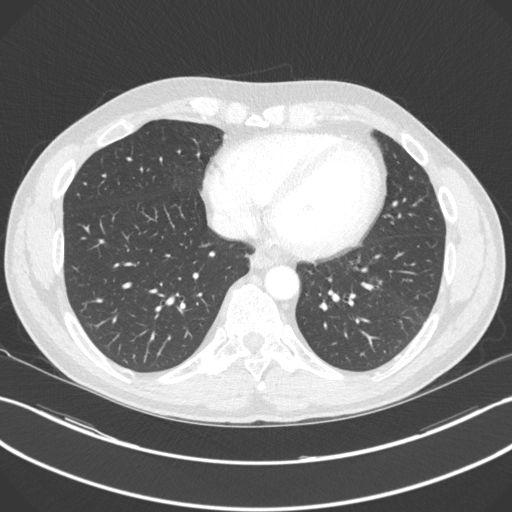
[im 61/163  mediastinal]
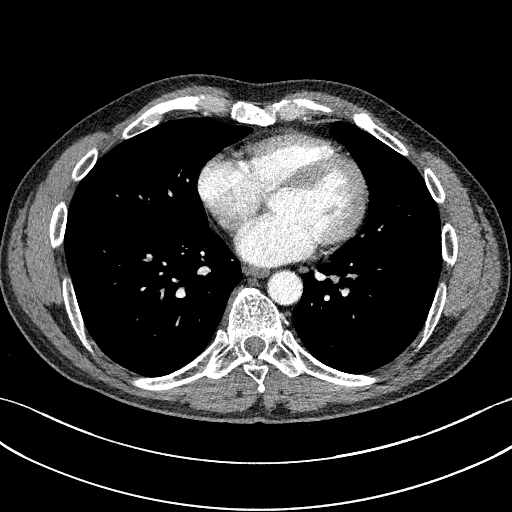
[im 61/163  lung]
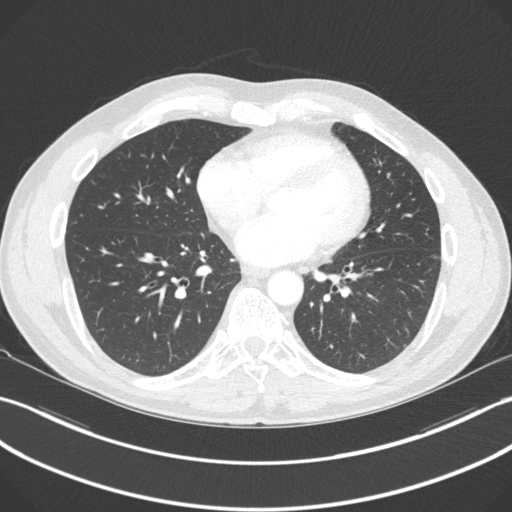
[im 73/163  lung]
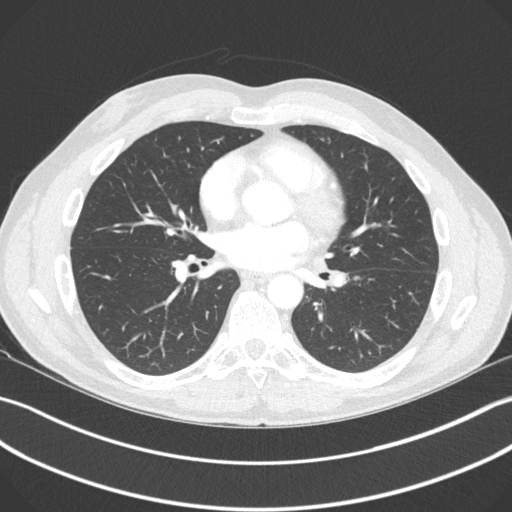
[im 91/163  lung]
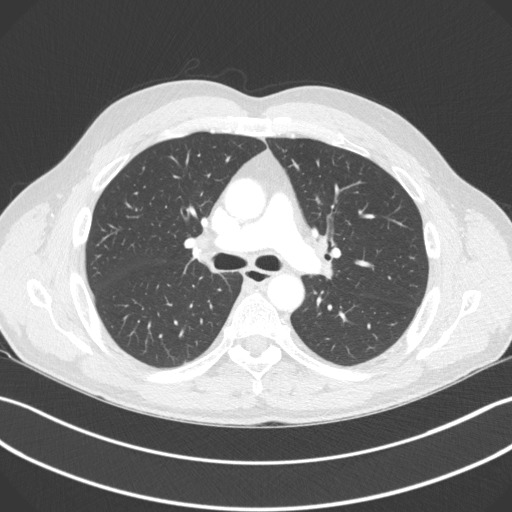
[im 103/163  lung]
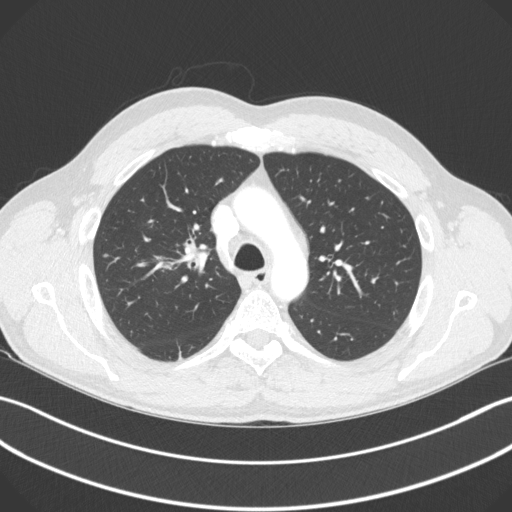
[im 115/163  mediastinal]
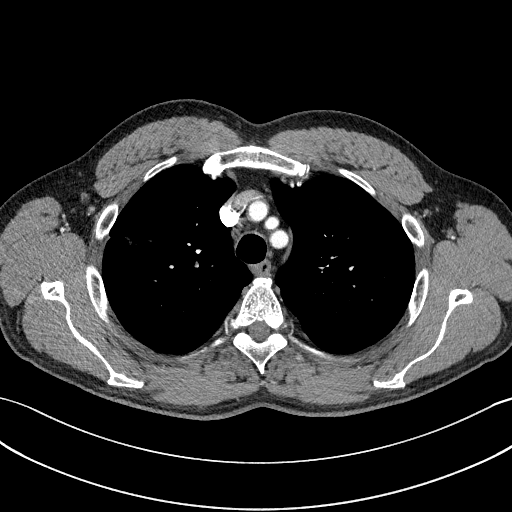
[im 115/163  lung]
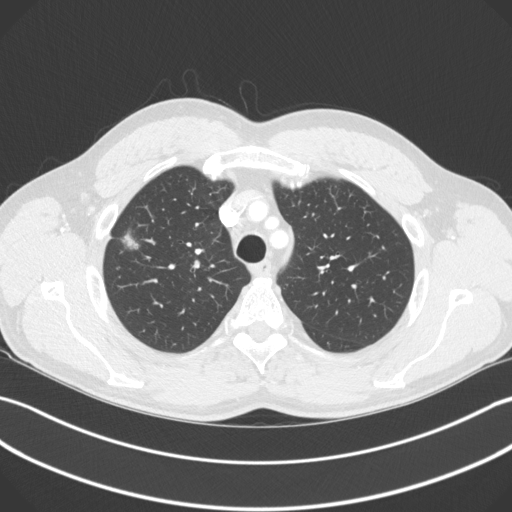
[im 127/163  lung]
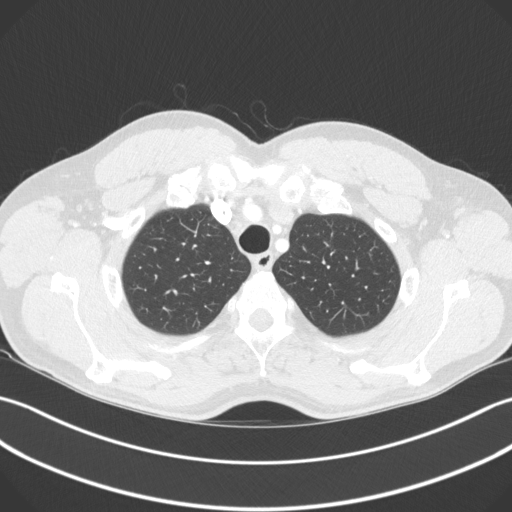
[im 139/163  lung]
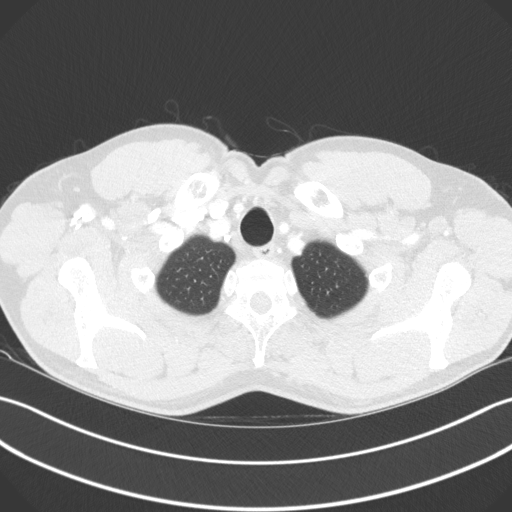
[im 151/163  lung]
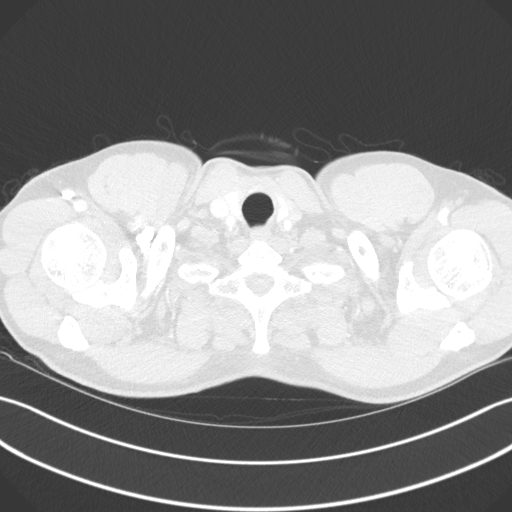

[Series 5: coronal · coronal · 0.67mm/px · 3 of 150 slices shown]
[im 30/150  lung]
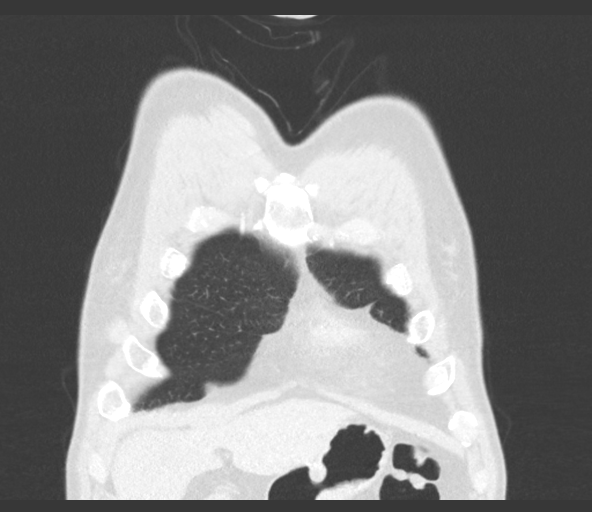
[im 60/150  lung]
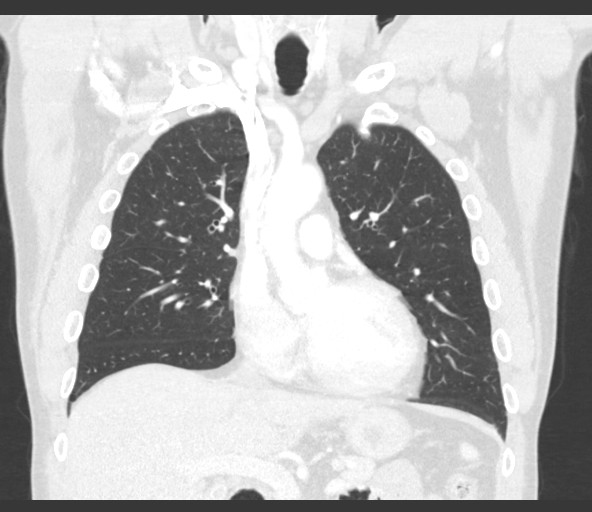
[im 90/150  lung]
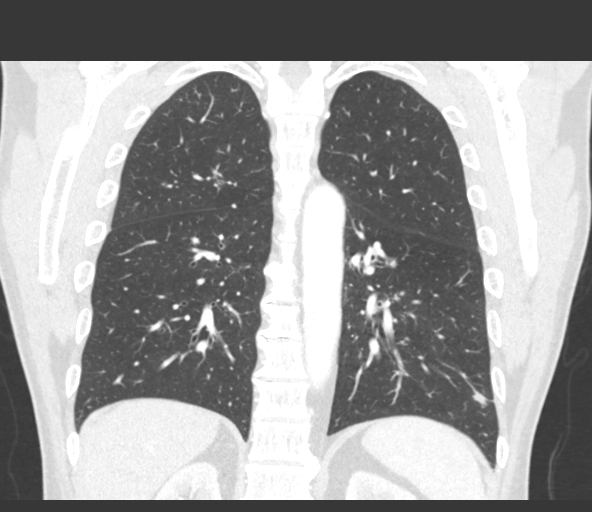

[15 of 36 positions shown; findings below may reference images not displayed]

FINDINGS: Cardiovascular: No significant vascular findings. Normal heart size.
No pericardial effusion. There appears to be occlusion of one of the
peripheral left lower lobe segmental pulmonary arteries. This
appears chronic but slightly worsened when compared to prior study.

Mediastinum/Nodes: Multiple small but prominent mediastinal and
hilar lymph nodes are again noted. There is no significant axillary
adenopathy. There are a few prominent right supraclavicular lymph
nodes.

Lungs/Pleura: Again identified are scattered pulmonary nodules
bilaterally. Some of these pulmonary nodules have increased in size
since the prior study. For example there is a right upper lobe
pulmonary nodule measuring approximately 1.5 cm on today's exam
(axial series 2, image 52). This nodule previously measured
approximately 1.3 cm. There are multiple additional scattered
pulmonary nodules throughout the right upper lobe, many of which
appear enlarged compared to prior study. The right middle lobe and
right lower lobe are relatively spared. There are a few small
pulmonary nodules in the left upper lobe. The left lower lobe
pulmonary nodules are stable to slightly increased in size. There is
no pneumothorax. No large pleural effusion. The central airways are
patent. The centrally located left lower lobe nodular opacity has
increased slightly from prior study.

Upper Abdomen: Multiple gallstones are noted. Otherwise, the upper
abdomen is unremarkable.

Musculoskeletal: Is worsening asymmetric gynecomastia, right worse
than left. No acute or significant osseous findings.
IMPRESSION: 1. Slight interval increase in several pulmonary nodules in the
right upper lobe. Again these can be seen in patients with
sarcoidosis and have only slightly increased since 8134. Interval
decrease in size of multiple left lower lobe pulmonary nodules when
compared to CT dated 08/21/2018.
2. Developing asymmetric gynecomastia, right worse than left. This
has slowly progressed since 8134, however the soft tissue in the
right subareolar region has significantly increased since 9318.
Correlation with physical exam is recommended. Consideration should
be given to dedicated mammography for further evaluation.
3. Cholelithiasis without definite CT evidence of acute
cholecystitis.

## 2020-04-21 ENCOUNTER — Other Ambulatory Visit: Payer: Self-pay

## 2020-04-21 DIAGNOSIS — R918 Other nonspecific abnormal finding of lung field: Secondary | ICD-10-CM

## 2020-04-30 ENCOUNTER — Ambulatory Visit: Payer: 59 | Admitting: Adult Health

## 2020-05-19 ENCOUNTER — Ambulatory Visit (HOSPITAL_COMMUNITY)
Admission: RE | Admit: 2020-05-19 | Discharge: 2020-05-19 | Disposition: A | Discharge status: Home / Self Care | Payer: 59 | Source: Ambulatory Visit | Attending: Pulmonary Disease | Admitting: Pulmonary Disease

## 2020-05-19 ENCOUNTER — Other Ambulatory Visit: Payer: Self-pay

## 2020-05-19 DIAGNOSIS — R918 Other nonspecific abnormal finding of lung field: Secondary | ICD-10-CM | POA: Diagnosis present

## 2020-05-19 LAB — GLUCOSE, CAPILLARY: Glucose-Capillary: 91 mg/dL (ref 70–99)

## 2020-05-19 MED ORDER — FLUDEOXYGLUCOSE F - 18 (FDG) INJECTION
9.1500 | Freq: Once | INTRAVENOUS | Status: AC | PRN
  Administered 2020-05-19: 9.15 via INTRAVENOUS

## 2020-05-21 ENCOUNTER — Other Ambulatory Visit: Payer: Self-pay | Admitting: *Deleted

## 2020-05-21 NOTE — Progress Notes (Signed)
The propose treatment discussed in cancer conference 05/21/20 is for discussion purpose only and is not a binding recommendation.  The patient was not physically examined nor present for their treatment options.  Therefore, final treatment plans cannot be decided.  Dr. Lamonte Sakai is updated and will speak to Dr. Elsworth Soho

## 2020-05-26 ENCOUNTER — Encounter (HOSPITAL_BASED_OUTPATIENT_CLINIC_OR_DEPARTMENT_OTHER): Payer: Self-pay

## 2020-05-26 ENCOUNTER — Emergency Department (HOSPITAL_BASED_OUTPATIENT_CLINIC_OR_DEPARTMENT_OTHER)
Admission: EM | Admit: 2020-05-26 | Discharge: 2020-05-26 | Disposition: A | Discharge status: Home / Self Care | Payer: 59 | Attending: Emergency Medicine | Admitting: Emergency Medicine

## 2020-05-26 ENCOUNTER — Emergency Department (HOSPITAL_BASED_OUTPATIENT_CLINIC_OR_DEPARTMENT_OTHER): Payer: 59

## 2020-05-26 ENCOUNTER — Other Ambulatory Visit: Payer: Self-pay

## 2020-05-26 DIAGNOSIS — Z96651 Presence of right artificial knee joint: Secondary | ICD-10-CM | POA: Insufficient documentation

## 2020-05-26 DIAGNOSIS — N5089 Other specified disorders of the male genital organs: Secondary | ICD-10-CM | POA: Diagnosis present

## 2020-05-26 DIAGNOSIS — N50811 Right testicular pain: Secondary | ICD-10-CM

## 2020-05-26 DIAGNOSIS — N433 Hydrocele, unspecified: Secondary | ICD-10-CM

## 2020-05-26 LAB — URINALYSIS, ROUTINE W REFLEX MICROSCOPIC
Bilirubin Urine: NEGATIVE
Glucose, UA: NEGATIVE mg/dL
Hgb urine dipstick: NEGATIVE
Ketones, ur: NEGATIVE mg/dL
Leukocytes,Ua: NEGATIVE
Nitrite: NEGATIVE
Protein, ur: NEGATIVE mg/dL
Specific Gravity, Urine: 1.025 (ref 1.005–1.030)
pH: 5.5 (ref 5.0–8.0)

## 2020-05-26 NOTE — ED Triage Notes (Signed)
Pt c/o swelling right testicle day 2-NAD-steady gait

## 2020-05-26 NOTE — ED Provider Notes (Signed)
Emergency Department Provider Note   I have reviewed the triage vital signs and the nursing notes.   HISTORY  Chief Complaint Groin Swelling   HPI GEO SLONE is a 64 y.o. male with past medical history reviewed below presents to the emergency department with right testicle discomfort with swelling.  Symptoms were first noticed yesterday without clear provoking factors.  Patient states he does lift weights but denies clear association with this.  No known injury.  He denies any dysuria, hesitancy, urgency.  No concern for sexually transmitted infection.  No urethral discharge.  The pain and swelling is in the right testicle only.  He has not had fevers or shaking chills.  No lower abdominal, flank, back pain. No radiation of symptoms or modifying factors.   Past Medical History:  Diagnosis Date  . Allergic rhinitis   . Arthritis   . Childhood asthma   . Closed head injury with concussion    multiple times secondary to sports/rock climbing  . Foot drop, right   . Gallstones   . GERD (gastroesophageal reflux disease)   . Numbness and tingling    right leg   . Pneumonia    last episode 4 to 5 years ago   . Pulmonary embolism ([place])    right lung  . Rupture of right triceps tendon   . Solitary pulmonary nodule 02/21/13   CT-PA performed 02/21/13. Enlarging on repeat CT scan 02/26/14. PET performed 02/28/14.   Marland Kitchen Staph infection    right knee   . Tinnitus   . Vertigo     Patient Active Problem List   Diagnosis Date Noted  . 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 S/P knee replacement 12/18/2015  . Pulmonary embolism ([place]) 02/21/2013  . Pulmonary nodule 02/21/2013  . Sarcoidosis 02/21/2013    Past Surgical History:  Procedure Laterality Date  . amputation great right toe     . ANKLE SURGERY    . BACK SURGERY    . EYE SURGERY     LASIK 1996   . KNEE SURGERY     right knee scoped 1992; I&D 6 times;   . left ring finger      secondary to tendon tear   . PICC LINE PLACE  PERIPHERAL ([place] HX)     history of in 1992 secondary to staph infection   . TOTAL KNEE ARTHROPLASTY Right 12/18/2015   Procedure: RIGHT TOTAL KNEE ARTHROPLASTY;  Surgeon: Paralee Cancel, MD;  Location: [facility];  Service: Orthopedics;  Laterality: Right;  . TRICEPS TENDON REPAIR Right 04/08/2016   Procedure: RIGHT TRICEPS REPAIR BURSECTOMY ;  Surgeon: Roseanne Kaufman, MD;  Location: [facility];  Service: Orthopedics;  Laterality: Right;  . ULNAR NERVE TRANSPOSITION Right 04/08/2016   Procedure: ULNAR NERVE DECOMPRESSION;  Surgeon: Roseanne Kaufman, MD;  Location: [facility];  Service: Orthopedics;  Laterality: Right;    Allergies Peanut-containing drug products, Mustard [allyl isothiocyanate], and Hydrocodone  Family History  Problem Relation Age of Onset  . Hypertension Mother   . Sjogren's syndrome Mother   . Celiac disease Sister     Social History Social History   Tobacco Use  . Smoking status: Never Smoker  . Smokeless tobacco: Never Used  Substance Use Topics  . Alcohol use: No  . Drug use: No    Review of Systems  Constitutional: No fever/chills Cardiovascular: Denies chest pain. Respiratory: Denies shortness of breath. Gastrointestinal: No abdominal pain.  No nausea, no vomiting.  No diarrhea.  No constipation.  Genitourinary: Negative for dysuria. Positive right testicle pain and swelling.  Musculoskeletal: Negative for back pain. Skin: Negative for rash.  10-point ROS otherwise negative.  ____________________________________________   PHYSICAL EXAM:  VITAL SIGNS: ED Triage Vitals  Enc Vitals Group     BP 05/26/20 1525 (!) 140/91     Pulse Rate 05/26/20 1525 73     Resp 05/26/20 1525 18     Temp 05/26/20 1525 98.5 F (36.9 C)     Temp Source 05/26/20 1525 Oral     SpO2 05/26/20 1525 100 %     Weight 05/26/20 1525 184 lb (83.5 kg)     Height 05/26/20 1525 5\' 11"  (1.803 m)   Constitutional: Alert and oriented. Well appearing and in no acute distress. Eyes:  Conjunctivae are normal.  Head: Atraumatic. Nose: No congestion/rhinnorhea. Mouth/Throat: Mucous membranes are moist.  Neck: No stridor.  Cardiovascular: Normal rate, regular rhythm.  Respiratory: Normal respiratory effort.  Gastrointestinal: Soft and nontender. No distention.  Genitourinary: Exam performed with patient's verbal consent.  There is mild swelling to the right testicle with some diffuse discomfort.  No overlying erythema or scrotal cellulitis/induration.  No evidence of abscess.  No palpable inguinal hernia.  Musculoskeletal: No gross deformities of extremities. Neurologic:  Normal speech and language.  Skin:  Skin is warm, dry and intact. No rash noted.  ____________________________________________   LABS (all labs ordered are listed, but only abnormal results are displayed)  Labs Reviewed  URINE CULTURE  URINALYSIS, ROUTINE W REFLEX MICROSCOPIC   ____________________________________________  RADIOLOGY  US SCROTUM W/DOPPLER  Result Date: 05/26/2020 CLINICAL DATA:  Right testicular pain and swelling. EXAM: SCROTAL ULTRASOUND DOPPLER ULTRASOUND OF THE TESTICLES TECHNIQUE: Complete ultrasound examination of the testicles, epididymis, and other scrotal structures was performed. Color and spectral Doppler ultrasound were also utilized to evaluate blood flow to the testicles. COMPARISON:  None. FINDINGS: Right testicle Measurements: 3.7 x 2.6 x 3.1 cm. Homogeneous echogenicity. Normal blood flow. No mass or microlithiasis visualized. Left testicle Measurements: 4.0 x 2.5 x 3.3 cm. Homogeneous echogenicity. Normal blood flow. No mass or microlithiasis visualized. Right epididymis:  Normal in size and appearance. Left epididymis:  Normal in size and appearance. Hydrocele:  Small to moderate bilateral. Varicocele:  None visualized. Pulsed Doppler interrogation of both testes demonstrates normal low resistance arterial and venous waveforms bilaterally. IMPRESSION: 1. Small to moderate  bilateral hydroceles. 2. Otherwise unremarkable scrotal ultrasound. Normal blood flow to both testicles. Electronically Signed   By: Keith Rake M.D.   On: 05/26/2020 16:22    ____________________________________________   PROCEDURES  Procedure(s) performed:   Procedures  None  ____________________________________________   INITIAL IMPRESSION / ASSESSMENT AND PLAN / ED COURSE  Pertinent labs & imaging results that were available during my care of the patient were reviewed by me and considered in my medical decision making (see chart for details).   Patient presents emergency department with right testicle pain and swelling without clear provoking event.  He has mild swelling on exam.  My suspicion for torsion is very low.  Suspect hydrocele clinically.  Do not palpate a right inguinal hernia.  No overlying scrotal cellulitis.   04:40 PM  Ultrasound reviewed showing evidence of bilateral hydroceles likely causing the patient's discomfort.  He has no evidence of urine infection.  No symptoms to suspect sexually transmitted infection.  Exam not consistent with epididymitis.  Provided contact information for urology and advised Tylenol and/or Motrin as needed for pain.  Discussed ED return precautions. 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  Nanda Quinton, MD, [facility] Emergency Medicine    Ramsey Midgett, [facility], MD 05/26/20 [phone]

## 2020-05-26 NOTE — Discharge Instructions (Signed)
You were seen in the emergency department today with testicle pain.  There is no twisting of the testicle or infection.  You do have some fluid buildup around both testicles but you are experiencing symptoms on the right.  I have listed the name for a urologist to follow-up with in the next 1 to 2 weeks if your symptoms continue.  Mostly these hydroceles go away on their own.  If you develop new or suddenly worsening pain, fever, redness in the area should return to the emergency department for reevaluation.

## 2020-05-27 ENCOUNTER — Telehealth: Payer: Self-pay | Admitting: Pulmonary Disease

## 2020-05-27 DIAGNOSIS — R918 Other nonspecific abnormal finding of lung field: Secondary | ICD-10-CM

## 2020-05-27 LAB — URINE CULTURE: Culture: NO GROWTH

## 2020-05-27 NOTE — Telephone Encounter (Signed)
Spoke with patient. He is ok with proceeding with the [place]. Will go ahead and place the order.   RA, please advise about the PET scan when you have a chance. Thanks!

## 2020-05-27 NOTE — Telephone Encounter (Signed)
Please place order for [place] of thyroid under Dr. Bari Mantis name.  Dr. Elsworth Soho can go over the PET scan results with the patient upon his return 6/14, so please send him a message.Thanks so much

## 2020-05-27 NOTE — Telephone Encounter (Signed)
Spoke with the pt  He is asking for PET results from 05/19/20  He is asking if Dr Elsworth Soho thinks he needs a thyroid biopsy  Please advise, thanks!   Forwarding to APP of the day to see if appropriate for them to advise, as Dr Elsworth Soho is out of the office until 06/01/20.

## 2020-05-28 ENCOUNTER — Ambulatory Visit
Admission: RE | Admit: 2020-05-28 | Discharge: 2020-05-28 | Disposition: A | Discharge status: Home / Self Care | Payer: 59 | Source: Ambulatory Visit | Attending: Pulmonary Disease | Admitting: Pulmonary Disease

## 2020-05-28 DIAGNOSIS — R918 Other nonspecific abnormal finding of lung field: Secondary | ICD-10-CM

## 2020-06-01 NOTE — Telephone Encounter (Signed)
Patient has been scheduled with Dr. Elsworth Soho for tomorrow at 11:45. Nothing further needed at this time

## 2020-06-01 NOTE — Telephone Encounter (Signed)
Please arrange for office visit tomorrow 11:45 AM to go over PET scan and thyroid results

## 2020-06-02 ENCOUNTER — Encounter: Payer: Self-pay | Admitting: Pulmonary Disease

## 2020-06-02 ENCOUNTER — Other Ambulatory Visit: Payer: Self-pay

## 2020-06-02 ENCOUNTER — Ambulatory Visit: Payer: 59 | Admitting: Pulmonary Disease

## 2020-06-02 VITALS — BP 132/86 | HR 73 | Temp 97.7°F | Ht 71.0 in | Wt 188.0 lb

## 2020-06-02 DIAGNOSIS — D869 Sarcoidosis, unspecified: Secondary | ICD-10-CM

## 2020-06-02 DIAGNOSIS — E041 Nontoxic single thyroid nodule: Secondary | ICD-10-CM | POA: Diagnosis not present

## 2020-06-02 DIAGNOSIS — I7090 Unspecified atherosclerosis: Secondary | ICD-10-CM | POA: Diagnosis not present

## 2020-06-02 NOTE — Assessment & Plan Note (Signed)
50% left carotid plaque noted, defer to PCP for further work-up with carotid ultrasound and lipid profile

## 2020-06-02 NOTE — Addendum Note (Signed)
Addended by: Edythe Clarity on: 06/02/2020 02:04 PM   Modules accepted: Orders

## 2020-06-02 NOTE — Assessment & Plan Note (Signed)
We discussed increasing nodules -hypermetabolic.  This would be consistent with sarcoidosis also not external iliac lymph node.  Has parenchymal involvement so doubt lymphoma.  Also previous biopsy showing granulomas ACE level was low so cannot follow No systemic improvement with course of prednisone tried earlier so we will hold off unless he develops any systemic symptoms  Follow-up CT chest with contrast June/2022 -he is agreeable with this plan and prefers a 1 year follow-up rather than 6 months

## 2020-06-02 NOTE — Assessment & Plan Note (Signed)
Plan for ultrasound-guided needle aspiration of thyroid nodule by interventional radiology

## 2020-06-02 NOTE — Patient Instructions (Signed)
Plan for ultrasound-guided needle aspiration of thyroid nodule by interventional radiology  Follow-up CT chest with contrast June/2022  Defer carotid ultrasound to Dr. Inda Merlin

## 2020-06-02 NOTE — Progress Notes (Signed)
Subjective:    Patient ID: Jeffrey Adkins, male    DOB: [birth year], 64 y.o.   MRN: [id number]  HPI  5 never smoker, AttorneyFor follow-up ofright upper lobe nodule and Mediastinallymphadenopathy-likely sarcoidosis, He has a history of childhood asthma and unprovoked pulmonary embolism in 2014 after a trip to [place] for which he took Xarelto for 4 months   COURSE :  He was found to have a 33mm RUL nodule on CT scan when the PE was dx 02/2013 He had repeat CT scan 02/26/14, PET 02/28/14 that showed the nodule to be increased in size, slightly hypermetabolic on PET, alsohypermetabolic mediastinal nodes borderlineenlarged.    CT chest 06/2017 showed right upper lobe nodule increased in size to 13 mm and 9 mm left lower lobe nodule which was also increased compared to prior imaging,these were both hypermetabolic on PET.PET scan also showed hypermetabolic subcarinal and hilar lymph nodes has also an external iliac,may be also a lymph node along the caudate lobe of liver. At least one of the mediastinallymph nodesappeared to be calcified in the interim  He underwent CT-guided needle biopsy of right upper lobe nodule on 07/24/17 and mediastinoscopy on 07/25/17 at [facility] by Dr. Baldemar Friday.Pathology showed necrotizing and nonnecrotizing granulomas ,fungal stains and cultures negative .DNA probes were negative for mycobacteria, bacteriaand fungus.   Serial imaging showed appearance of new left lower lobe 2.5 cm nodulein 2019 which then decreased in size. Lung functionalso decreasedhence we treated him with low-dose prednisone for 2 to 3 months without symptomatic benefit    There was increasing gynecomastia on the right-he reports that the subcutaneous nodule in the right breast area has been present for more than 15 years and has waxed and waned over the years he had a mammogram in the remote past which was negative. Prior PET scans  show mild hypermetabolism in the subcutaneous  mass in 2015 but none in2018  We reviewed his PET scan and ultrasound thyroid today. He denies any worsening dyspnea or cough  PET 05/2020 >> Increasing size and number of pulmonary nodules particularly in the RIGHT chest but with bronchovascular distribution . Increasing metabolic activity particularly in the RIGHT upper lobe  US thyroid >>1.7 cm TR4 thyroid nodule in the left superior thyroid gland, 50 % Lt CCA plaque   Significant tests/ events reviewed  CT chest from 04/23/2019 which showed slight increase in nodules in the right upper lobe,left lower lobe nodules were stable.  CTchest11/2018 which shows stable nodules including the right upper lobe 13 mm and 8 mm mediastinal lymph nodes all stable  CT chest 04/2018 which shows new left lower lobe nodule 2.5 x 2.1 cm slight increase in right upper lobe nodule otherwise stable changes  CT chest 08/2018 2 cm nodule left base + 1 cm satellite nodule , RUL stable, other nodules stable   Spirometry10/2019lung function back to normal with ratio 77, FEV1 of 82% and FVC of 81%  Spirometry11/2018drop in lung function with FEV1 of 59% FVC of 59% a ratio of 76 suggesting moderate restriction  PFTs 06/2017 show normal ratio, FEV1 of 83%, FVC of 86% and DLCO of 80% Calcium 9.9 ACE 19  Review of Systems Patient denies significant dyspnea,cough, hemoptysis,  chest pain, palpitations, pedal edema, orthopnea, paroxysmal nocturnal dyspnea, lightheadedness, nausea, vomiting, abdominal or  leg pains      Objective:   Physical Exam   Gen. Pleasant, well-nourished, in no distress ENT - no thrush, no pallor/icterus,no post nasal drip Neck: No JVD, no  thyromegaly, no carotid bruits Lungs: no use of accessory muscles, no dullness to percussion, clear without rales or rhonchi  Cardiovascular: Rhythm regular, heart sounds  normal, no murmurs or gallops, no peripheral edema Musculoskeletal: No deformities, no cyanosis or clubbing           Assessment & Plan:

## 2020-06-23 ENCOUNTER — Ambulatory Visit
Admission: RE | Admit: 2020-06-23 | Discharge: 2020-06-23 | Disposition: A | Discharge status: Home / Self Care | Payer: 59 | Source: Ambulatory Visit | Attending: Pulmonary Disease | Admitting: Pulmonary Disease

## 2020-06-23 ENCOUNTER — Other Ambulatory Visit (HOSPITAL_COMMUNITY)
Admission: RE | Admit: 2020-06-23 | Discharge: 2020-06-23 | Disposition: A | Discharge status: Home / Self Care | Payer: 59 | Source: Ambulatory Visit | Attending: Radiology | Admitting: Radiology

## 2020-06-23 DIAGNOSIS — E041 Nontoxic single thyroid nodule: Secondary | ICD-10-CM | POA: Insufficient documentation

## 2020-06-24 LAB — CYTOLOGY - NON PAP

## 2021-04-20 ENCOUNTER — Emergency Department (HOSPITAL_BASED_OUTPATIENT_CLINIC_OR_DEPARTMENT_OTHER)
Admission: EM | Admit: 2021-04-20 | Discharge: 2021-04-20 | Disposition: A | Discharge status: Home / Self Care | Payer: 59 | Attending: Emergency Medicine | Admitting: Emergency Medicine

## 2021-04-20 ENCOUNTER — Other Ambulatory Visit: Payer: Self-pay

## 2021-04-20 ENCOUNTER — Encounter (HOSPITAL_BASED_OUTPATIENT_CLINIC_OR_DEPARTMENT_OTHER): Payer: Self-pay | Admitting: Emergency Medicine

## 2021-04-20 ENCOUNTER — Emergency Department (HOSPITAL_BASED_OUTPATIENT_CLINIC_OR_DEPARTMENT_OTHER): Payer: 59

## 2021-04-20 DIAGNOSIS — Z96651 Presence of right artificial knee joint: Secondary | ICD-10-CM | POA: Diagnosis not present

## 2021-04-20 DIAGNOSIS — J45909 Unspecified asthma, uncomplicated: Secondary | ICD-10-CM | POA: Insufficient documentation

## 2021-04-20 DIAGNOSIS — R1013 Epigastric pain: Secondary | ICD-10-CM | POA: Insufficient documentation

## 2021-04-20 DIAGNOSIS — Z91010 Allergy to peanuts: Secondary | ICD-10-CM | POA: Diagnosis not present

## 2021-04-20 DIAGNOSIS — R0789 Other chest pain: Secondary | ICD-10-CM | POA: Diagnosis present

## 2021-04-20 DIAGNOSIS — K2100 Gastro-esophageal reflux disease with esophagitis, without bleeding: Secondary | ICD-10-CM | POA: Diagnosis not present

## 2021-04-20 LAB — CBC WITH DIFFERENTIAL/PLATELET
Abs Immature Granulocytes: 0.02 10*3/uL (ref 0.00–0.07)
Basophils Absolute: 0.1 10*3/uL (ref 0.0–0.1)
Basophils Relative: 1 %
Eosinophils Absolute: 0.1 10*3/uL (ref 0.0–0.5)
Eosinophils Relative: 2 %
HCT: 41.9 % (ref 39.0–52.0)
Hemoglobin: 14.8 g/dL (ref 13.0–17.0)
Immature Granulocytes: 0 %
Lymphocytes Relative: 16 %
Lymphs Abs: 1.3 10*3/uL (ref 0.7–4.0)
MCH: 30.8 pg (ref 26.0–34.0)
MCHC: 35.3 g/dL (ref 30.0–36.0)
MCV: 87.3 fL (ref 80.0–100.0)
Monocytes Absolute: 0.8 10*3/uL (ref 0.1–1.0)
Monocytes Relative: 11 %
Neutro Abs: 5.5 10*3/uL (ref 1.7–7.7)
Neutrophils Relative %: 70 %
Platelets: 207 10*3/uL (ref 150–400)
RBC: 4.8 MIL/uL (ref 4.22–5.81)
RDW: 13.1 % (ref 11.5–15.5)
WBC: 7.8 10*3/uL (ref 4.0–10.5)
nRBC: 0 % (ref 0.0–0.2)

## 2021-04-20 LAB — COMPREHENSIVE METABOLIC PANEL
ALT: 228 U/L — ABNORMAL HIGH (ref 0–44)
AST: 270 U/L — ABNORMAL HIGH (ref 15–41)
Albumin: 4.1 g/dL (ref 3.5–5.0)
Alkaline Phosphatase: 95 U/L (ref 38–126)
Anion gap: 10 (ref 5–15)
BUN: 22 mg/dL (ref 8–23)
CO2: 26 mmol/L (ref 22–32)
Calcium: 9.1 mg/dL (ref 8.9–10.3)
Chloride: 101 mmol/L (ref 98–111)
Creatinine, Ser: 1.11 mg/dL (ref 0.61–1.24)
GFR, Estimated: >60 mL/min (ref >60)
Glucose, Bld: 108 mg/dL — ABNORMAL HIGH (ref 70–99)
Potassium: 3.6 mmol/L (ref 3.5–5.1)
Sodium: 137 mmol/L (ref 135–145)
Total Bilirubin: 1.6 mg/dL — ABNORMAL HIGH (ref 0.3–1.2)
Total Protein: 6.8 g/dL (ref 6.5–8.1)

## 2021-04-20 LAB — D-DIMER, QUANTITATIVE: D-Dimer, Quant: 0.61 ug/mL-FEU — ABNORMAL HIGH (ref 0.00–0.50)

## 2021-04-20 LAB — LIPASE, BLOOD: Lipase: 40 U/L (ref 11–51)

## 2021-04-20 LAB — TROPONIN I (HIGH SENSITIVITY)
Troponin I (High Sensitivity): 7 ng/L (ref <18)
Troponin I (High Sensitivity): 5 ng/L (ref <18)

## 2021-04-20 MED ORDER — ALUM & MAG HYDROXIDE-SIMETH 200-200-20 MG/5ML PO SUSP
30.0000 mL | Freq: Once | ORAL | Status: AC
  Administered 2021-04-20: 30 mL via ORAL
  Filled 2021-04-20: qty 30

## 2021-04-20 MED ORDER — LIDOCAINE VISCOUS HCL 2 % MT SOLN
15.0000 mL | Freq: Once | OROMUCOSAL | Status: AC
  Administered 2021-04-20: 15 mL via ORAL
  Filled 2021-04-20: qty 15

## 2021-04-20 MED ORDER — OMEPRAZOLE 20 MG PO CPDR: 40.0000 mg | DELAYED_RELEASE_CAPSULE | Freq: Every day | ORAL | 0 refills | Status: DC

## 2021-04-20 MED ORDER — IOHEXOL 300 MG/ML  SOLN
100.0000 mL | Freq: Once | INTRAMUSCULAR | Status: AC | PRN
  Administered 2021-04-20: 100 mL via INTRAVENOUS

## 2021-04-20 MED ORDER — ONDANSETRON HCL 4 MG/2ML IJ SOLN
4.0000 mg | Freq: Once | INTRAMUSCULAR | Status: AC
  Administered 2021-04-20: 4 mg via INTRAVENOUS
  Filled 2021-04-20: qty 2

## 2021-04-20 NOTE — ED Triage Notes (Signed)
Pt took prilosec at 1900 and 2200 without improvement.

## 2021-04-20 NOTE — ED Triage Notes (Signed)
Pt c/o burning type pain across diaphragm area and sharp pain in back  Between shoulder blades. Pt reports vomiting x 3 episodes. Denies any shob or diaphoresis.

## 2021-04-20 NOTE — ED Provider Notes (Signed)
[facility] EMERGENCY DEPARTMENT Provider Note   CSN: [id number] Arrival date & time: 04/20/21  0229     History Chief Complaint  Patient presents with  . Chest Pain    Jeffrey Adkins is a 65 y.o. male.  HPI     This is a 65 year old male with a history of PE, reflux who presents with chest discomfort.  Patient reports acute onset of burning/sharp chest pain at 7 PM.  He just finished eating some potato chips.  Initially he thought it was his reflux and he took Prilosec x2 with no relief.  Pain is worse with lying flat.  Nothing seems to make it better.  It radiates between his shoulder blades.  Current pain is 8 out of 10.  It is not exertional in nature.  Patient reports remote history of PE and is not on any ongoing anticoagulation.  He states there are some features of the pain that also remind him of his PE.  He has not noted any lower extremity swelling or pain.  He denies any recent coughs or fevers.  Patient reports associated multiple episodes of nonbilious, nonbloody emesis.  Past Medical History:  Diagnosis Date  . Allergic rhinitis   . Arthritis   . Childhood asthma   . Closed head injury with concussion    multiple times secondary to sports/rock climbing  . Foot drop, right   . Gallstones   . GERD (gastroesophageal reflux disease)   . Numbness and tingling    right leg   . Pneumonia    last episode 4 to 5 years ago   . Pulmonary embolism ([place])    right lung  . Rupture of right triceps tendon   . Solitary pulmonary nodule 02/21/13   CT-PA performed 02/21/13. Enlarging on repeat CT scan 02/26/14. PET performed 02/28/14.   Marland Kitchen Staph infection    right knee   . Tinnitus   . Vertigo     Patient Active Problem List   Diagnosis Date Noted  . Thyroid nodule 06/02/2020  . Atherosclerosis 06/02/2020  . 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 S/P knee replacement 12/18/2015  . Pulmonary embolism ([place]) 02/21/2013  . Pulmonary nodule 02/21/2013  . Sarcoidosis 02/21/2013     Past Surgical History:  Procedure Laterality Date  . amputation great right toe     . ANKLE SURGERY    . BACK SURGERY    . EYE SURGERY     LASIK 1996   . KNEE SURGERY     right knee scoped 1992; I&D 6 times;   . left ring finger      secondary to tendon tear   . PICC LINE PLACE PERIPHERAL ([facility] [facility])     history of in 1992 secondary to staph infection   . TOTAL KNEE ARTHROPLASTY Right 12/18/2015   Procedure: RIGHT TOTAL KNEE ARTHROPLASTY;  Surgeon: Paralee Cancel, MD;  Location: [facility];  Service: Orthopedics;  Laterality: Right;  . TRICEPS TENDON REPAIR Right 04/08/2016   Procedure: RIGHT TRICEPS REPAIR BURSECTOMY ;  Surgeon: Roseanne Kaufman, MD;  Location: [facility];  Service: Orthopedics;  Laterality: Right;  . ULNAR NERVE TRANSPOSITION Right 04/08/2016   Procedure: ULNAR NERVE DECOMPRESSION;  Surgeon: Roseanne Kaufman, MD;  Location: [facility];  Service: Orthopedics;  Laterality: Right;       Family History  Problem Relation Age of Onset  . Hypertension Mother   . Sjogren's syndrome Mother   . Celiac disease Sister     Social History  Tobacco Use  . Smoking status: Never Smoker  . Smokeless tobacco: Never Used  Vaping Use  . Vaping Use: Never used  Substance Use Topics  . Alcohol use: No  . Drug use: No    Home Medications Prior to Admission medications   Medication Sig Start Date End Date Taking? Authorizing Provider  omeprazole (PRILOSEC) 20 MG capsule Take 2 capsules (40 mg total) by mouth daily. 04/20/21  Yes Makyra Corprew, Barbette Hair, MD  EPINEPHrine 0.3 mg/0.3 mL IJ SOAJ injection Inject into the muscle once.    [provider]  famotidine (PEPCID) 10 MG tablet Take 10 mg by mouth daily as needed.     [provider]  ibuprofen (ADVIL,MOTRIN) 600 MG tablet Take 600 mg by mouth every 8 (eight) hours as needed.    [provider]    Allergies    Peanut-containing drug products, Mustard [allyl isothiocyanate], and Hydrocodone  Review of Systems    Review of Systems  Constitutional: Negative for fever.  Respiratory: Negative for shortness of breath.   Cardiovascular: Positive for chest pain.  Gastrointestinal: Positive for nausea and vomiting. Negative for abdominal pain.  Genitourinary: Negative for dysuria.  All other systems reviewed and are negative.   Physical Exam Updated Vital Signs BP (!) 144/89 (BP Location: Right Arm)   Pulse (!) 51   Temp 98.2 F (36.8 C) (Oral)   Resp 13   Ht 1.803 m (5\' 11" )   Wt 85.3 kg   SpO2 100%   BMI 26.23 kg/m   Physical Exam Vitals and nursing note reviewed.  Constitutional:      Appearance: He is well-developed. He is not diaphoretic.  HENT:     Head: Normocephalic and atraumatic.  Eyes:     Pupils: Pupils are equal, round, and reactive to light.  Cardiovascular:     Rate and Rhythm: Normal rate and regular rhythm.     Heart sounds: Normal heart sounds. No murmur heard.   Pulmonary:     Effort: Pulmonary effort is normal. No respiratory distress.     Breath sounds: Normal breath sounds. No wheezing.  Abdominal:     General: Bowel sounds are normal.     Palpations: Abdomen is soft.     Tenderness: There is no abdominal tenderness. There is no rebound.  Musculoskeletal:     Cervical back: Neck supple.     Right lower leg: No tenderness. No edema.     Left lower leg: No tenderness. No edema.  Lymphadenopathy:     Cervical: No cervical adenopathy.  Skin:    General: Skin is warm and dry.  Neurological:     General: No focal deficit present.     Mental Status: He is alert and oriented to person, place, and time.     ED Results / Procedures / Treatments   Labs (all labs ordered are listed, but only abnormal results are displayed) Labs Reviewed  COMPREHENSIVE METABOLIC PANEL - Abnormal; Notable for the following components:      Result Value   Glucose, Bld 108 (*)    AST 270 (*)    ALT 228 (*)    Total Bilirubin 1.6 (*)    All other components within normal  limits  D-DIMER, QUANTITATIVE - Abnormal; Notable for the following components:   D-Dimer, Quant 0.61 (*)    All other components within normal limits  CBC WITH DIFFERENTIAL/PLATELET  LIPASE, BLOOD  TROPONIN I (HIGH SENSITIVITY)  TROPONIN I (HIGH SENSITIVITY)  EKG EKG Interpretation  Date/Time:  Tuesday Apr 20 2021 02:36:22 EDT Ventricular Rate:  60 PR Interval:  192 QRS Duration: 86 QT Interval:  428 QTC Calculation: 428 R Axis:   67 Text Interpretation: Sinus arrhythmia Ventricular premature complex Confirmed by Thayer Jew [phone]) on 04/20/2021 2:42:45 AM   Radiology DG Chest 2 View  Result Date: 04/20/2021 CLINICAL DATA:  Sharp back pain. EXAM: CHEST - 2 VIEW COMPARISON:  Chest plain film, dated June 18, 2013, and chest CT dated Apr 23, 2019 FINDINGS: A small ill-defined patchy opacities are seen overlying the lateral aspect of the upper right lung. There is no evidence of a pleural effusion or pneumothorax. The heart size and mediastinal contours are within normal limits. The visualized skeletal structures are unremarkable. IMPRESSION: Small patchy opacities within the right upper lobe which likely correspond to a nodular appearing areas seen within this region on the prior chest CT. Correlation with follow-up chest CT is recommended to exclude mild infiltrate. Electronically Signed   By: Virgina Norfolk M.D.   On: 04/20/2021 03:31   CT ABDOMEN PELVIS W CONTRAST  Result Date: 04/20/2021 CLINICAL DATA:  Epigastric pain EXAM: CT ABDOMEN AND PELVIS WITH CONTRAST TECHNIQUE: Multidetector CT imaging of the abdomen and pelvis was performed using the standard protocol following bolus administration of intravenous contrast. CONTRAST:  136mL OMNIPAQUE IOHEXOL 300 MG/ML  SOLN COMPARISON:  PET-CT 05/20/2019 FINDINGS: Lower chest: Left perihilar opacity with multiple nodules in the left lung base which are not significantly changed in size or configuration from PET-CT 05/19/2020.  Atelectatic changes elsewhere in the lung bases. Normal heart size. No pericardial effusion. Hepatobiliary: Diffuse hepatic hypoattenuation more geographic regions of hypoattenuation throughout the right lobe liver and sparing along the gallbladder fossa most often compatible with hepatic steatosis. Smooth liver surface contour. No concerning focal liver lesion. Gallbladder contains multiple partially calcified gallstones. No significant pericholecystic fluid or inflammation. No significant biliary ductal dilatation or intraductal gallstones. Pancreas: No pancreatic ductal dilatation or surrounding inflammatory changes. Spleen: Normal in size. No concerning splenic lesions. Adrenals/Urinary Tract: Normal adrenals. Kidneys are normally located with symmetric enhancement and excretion. No suspicious renal lesion, urolithiasis or hydronephrosis. Urinary bladder is unremarkable for the degree of distention. Stomach/Bowel: Fluid-filled and mildly thickened distal thoracic esophagus. Some mild thickening of the mid stomach likely reflecting peristaltic contraction. Duodenum with a normal sweep across the midline abdomen. No small bowel thickening or dilatation. Normal appendix coursing in a retrocecal fashion without periappendiceal inflammation. No colonic dilatation or wall thickening. Scattered colonic diverticula without focal inflammation to suggest diverticulitis. No evidence of bowel obstruction. Vascular/Lymphatic: Minimal atherosclerotic calcifications of the arterial vasculature. No aortic aneurysm or ectasia. No acute or worrisome vascular abnormality. No suspicious or enlarged lymph nodes in the included lymphatic chains. Reproductive: The prostate and seminal vesicles are unremarkable. Other: No abdominopelvic free fluid or free gas. No bowel containing hernias. Musculoskeletal: Transitional lumbosacral vertebrae, denoted as L6. Bony fusion of the L4-5 vertebral bodies and posterior elements. Multilevel  degenerative changes are present in the imaged portions of the spine. These features most pronounced L3-L6. Additional degenerative changes in the hips and pelvis. No acute osseous abnormality or suspicious osseous lesion. IMPRESSION: 1. Mild thickening and fluid-filled appearance of the distal thoracic esophagus, correlate for features of esophagitis. No evidence of perforation complication. 2. Cholelithiasis on evidence of acute cholecystitis or significant biliary ductal dilatation. 3. Features of hepatic steatosis. 4. Left perihilar opacity and multiple left lower lobe nodules, not significantly changed from PET-CT  dated 05/19/2020. Correlate with prior biopsy results and clinical history. Electronically Signed   By: Lovena Le M.D.   On: 04/20/2021 05:40    Procedures Procedures   Medications Ordered in ED Medications  alum & mag hydroxide-simeth (MAALOX/MYLANTA) 200-200-20 MG/5ML suspension 30 mL (30 mLs Oral Given 04/20/21 0253)    And  lidocaine (XYLOCAINE) 2 % viscous mouth solution 15 mL (15 mLs Oral Given 04/20/21 0253)  ondansetron (ZOFRAN) injection 4 mg (4 mg Intravenous Given 04/20/21 0252)  iohexol (OMNIPAQUE) 300 MG/ML solution 100 mL (100 mLs Intravenous Contrast Given 04/20/21 0509)    ED Course  I have reviewed the triage vital signs and the nursing notes.  Pertinent labs & imaging results that were available during my care of the patient were reviewed by me and considered in my medical decision making (see chart for details).  Clinical Course as of 04/20/21 0622  Tue Apr 20, 2021  0417 Patient did have some improvement of pain with GI cocktail.  He has some new elevation in AST and ALT with a bilirubin of 1.6 which is also new.  Denies any drinking.  Does report history of gallstones.  While improvement with GI cocktail with suggest likely reflux etiology, objective labs abnormalities warrant further evaluation.  No available ultrasound.  Will obtain CT.  Highly suspect GI  etiology. [CH]    Clinical Course User Index [CH] Ismael Karge, Barbette Hair, MD   MDM Rules/Calculators/A&P                          Patient presents with chest and epigastric pain.  He is overall nontoxic and vital signs are reassuring.  Symptoms are highly suggestive of GI etiology such as reflux.  However, other considerations include but not limited to, pancreatitis, cholecystitis, atypical ACS.  He does have a history of PE that is not currently on anticoagulants.  We will send a D-dimer for restratification's.  EKG is without ischemic changes.  Troponin x2 negative.  No significant electrolyte derangements.  D-dimer is 0.61 which is negative when considering age adjustment.  He does have some slight elevation in his LFTs and bilirubin.  This is of unclear significance.  Feel it is reasonable to evaluate gallbladder pathology given location of pain.  However, patient did have significant improvement with a GI cocktail.  CT scan does not show any evidence of gallbladder inflammation.  He has cholelithiasis.  He does have some thickening of the distal esophagus suggestive of esophagitis.  Discussed the results with the patient.  Highly suspect reflux with esophagitis.  Will start on daily omeprazole 40 mg.  Patient was given GI follow-up for endoscopy.  After history, exam, and medical workup I feel the patient has been appropriately medically screened and is safe for discharge home. Pertinent diagnoses were discussed with the patient. Patient was given return precautions.  Final Clinical Impression(s) / ED Diagnoses Final diagnoses:  Gastroesophageal reflux disease with esophagitis without hemorrhage    Rx / DC Orders ED Discharge Orders         Ordered    omeprazole (PRILOSEC) 20 MG capsule  Daily        04/20/21 [id number]           Merryl Hacker, MD 04/20/21 [id number]

## 2021-04-20 NOTE — ED Notes (Signed)
ED Provider at bedside. 

## 2021-04-20 NOTE — Discharge Instructions (Addendum)
You were seen today for epigastric and chest pain.  Your presentation is most consistent with esophagitis and gastritis.  Start omeprazole daily.  If you have ongoing symptoms, you should follow-up with gastroenterology.

## 2021-04-28 ENCOUNTER — Telehealth: Payer: Self-pay | Admitting: Pulmonary Disease

## 2021-04-28 NOTE — Telephone Encounter (Signed)
Yes.  Unfortunately CT abdomen and pelvis only picks up the bottom part of the lungs and this I reviewed which shows stable nodules, but he also had nodules on the right upper lung which would need a dedicated CT chest to review

## 2021-04-28 NOTE — Telephone Encounter (Signed)
Dr Elsworth Soho- this pt is asking he still needs ct chest in June 2022 since he just had a CT abd/pelvis and cxr on 04/20/21. Please advise, thanks

## 2021-04-28 NOTE — Telephone Encounter (Signed)
LMTCB x 1 

## 2021-04-30 NOTE — Telephone Encounter (Signed)
Left message for patient to call back  

## 2021-05-03 NOTE — Telephone Encounter (Signed)
Called and spoke with pt letting him know that he still needed to have CT chest and he verbalized understanding. Nothing further needed.

## 2021-05-19 ENCOUNTER — Other Ambulatory Visit: Payer: Self-pay

## 2021-05-19 ENCOUNTER — Inpatient Hospital Stay: Admission: RE | Admit: 2021-05-19 | Payer: 59 | Source: Ambulatory Visit

## 2021-05-19 DIAGNOSIS — E041 Nontoxic single thyroid nodule: Secondary | ICD-10-CM

## 2021-05-28 ENCOUNTER — Other Ambulatory Visit (INDEPENDENT_AMBULATORY_CARE_PROVIDER_SITE_OTHER): Payer: 59

## 2021-05-28 DIAGNOSIS — E041 Nontoxic single thyroid nodule: Secondary | ICD-10-CM

## 2021-05-28 LAB — BASIC METABOLIC PANEL
BUN: 18 mg/dL (ref 6–23)
CO2: 29 mEq/L (ref 19–32)
Calcium: 9.4 mg/dL (ref 8.4–10.5)
Chloride: 105 mEq/L (ref 96–112)
Creatinine, Ser: 1.19 mg/dL (ref 0.40–1.50)
GFR: 65.38 mL/min (ref >60.00)
Glucose, Bld: 77 mg/dL (ref 70–99)
Potassium: 4.1 mEq/L (ref 3.5–5.1)
Sodium: 140 mEq/L (ref 135–145)

## 2021-05-31 ENCOUNTER — Other Ambulatory Visit: Payer: 59

## 2021-05-31 ENCOUNTER — Other Ambulatory Visit: Payer: Self-pay

## 2021-05-31 ENCOUNTER — Ambulatory Visit (INDEPENDENT_AMBULATORY_CARE_PROVIDER_SITE_OTHER)
Admission: RE | Admit: 2021-05-31 | Discharge: 2021-05-31 | Disposition: A | Discharge status: Home / Self Care | Payer: 59 | Source: Ambulatory Visit | Attending: Pulmonary Disease | Admitting: Pulmonary Disease

## 2021-05-31 DIAGNOSIS — E041 Nontoxic single thyroid nodule: Secondary | ICD-10-CM

## 2021-05-31 MED ORDER — IOHEXOL 300 MG/ML  SOLN
80.0000 mL | Freq: Once | INTRAMUSCULAR | Status: AC | PRN
  Administered 2021-05-31: 80 mL via INTRAVENOUS

## 2021-06-02 ENCOUNTER — Telehealth: Payer: Self-pay | Admitting: Pulmonary Disease

## 2021-06-02 NOTE — Telephone Encounter (Signed)
Spoke with patient regarding CT chest result. They verbalized understanding. No further questions. Patient states that he is currently out of town and is going to call the office back to schedule appointment once he is home and has his calender.  Nothing further needed at this time.

## 2021-06-02 NOTE — Progress Notes (Signed)
LMTCB

## 2022-03-08 ENCOUNTER — Other Ambulatory Visit: Payer: Self-pay | Admitting: Internal Medicine

## 2022-03-08 DIAGNOSIS — E785 Hyperlipidemia, unspecified: Secondary | ICD-10-CM

## 2022-03-29 ENCOUNTER — Ambulatory Visit
Admission: RE | Admit: 2022-03-29 | Discharge: 2022-03-29 | Disposition: A | Discharge status: Home / Self Care | Payer: No Typology Code available for payment source | Source: Ambulatory Visit | Attending: Internal Medicine | Admitting: Internal Medicine

## 2022-03-29 DIAGNOSIS — E785 Hyperlipidemia, unspecified: Secondary | ICD-10-CM

## 2022-12-15 ENCOUNTER — Telehealth: Payer: Self-pay | Admitting: Internal Medicine

## 2022-12-15 ENCOUNTER — Encounter (HOSPITAL_BASED_OUTPATIENT_CLINIC_OR_DEPARTMENT_OTHER): Payer: Self-pay | Admitting: *Deleted

## 2022-12-15 ENCOUNTER — Other Ambulatory Visit (HOSPITAL_BASED_OUTPATIENT_CLINIC_OR_DEPARTMENT_OTHER): Payer: Self-pay

## 2022-12-15 ENCOUNTER — Emergency Department (HOSPITAL_BASED_OUTPATIENT_CLINIC_OR_DEPARTMENT_OTHER): Payer: 59

## 2022-12-15 ENCOUNTER — Emergency Department (HOSPITAL_BASED_OUTPATIENT_CLINIC_OR_DEPARTMENT_OTHER)
Admission: EM | Admit: 2022-12-15 | Discharge: 2022-12-15 | Disposition: A | Discharge status: Home / Self Care | Payer: 59 | Attending: Emergency Medicine | Admitting: Emergency Medicine

## 2022-12-15 DIAGNOSIS — I2609 Other pulmonary embolism with acute cor pulmonale: Secondary | ICD-10-CM | POA: Diagnosis not present

## 2022-12-15 DIAGNOSIS — Z91010 Allergy to peanuts: Secondary | ICD-10-CM | POA: Diagnosis not present

## 2022-12-15 DIAGNOSIS — R059 Cough, unspecified: Secondary | ICD-10-CM | POA: Diagnosis present

## 2022-12-15 DIAGNOSIS — Z7901 Long term (current) use of anticoagulants: Secondary | ICD-10-CM | POA: Insufficient documentation

## 2022-12-15 DIAGNOSIS — D72829 Elevated white blood cell count, unspecified: Secondary | ICD-10-CM | POA: Insufficient documentation

## 2022-12-15 LAB — BASIC METABOLIC PANEL
Anion gap: 7 (ref 5–15)
BUN: 14 mg/dL (ref 8–23)
CO2: 28 mmol/L (ref 22–32)
Calcium: 9.3 mg/dL (ref 8.9–10.3)
Chloride: 103 mmol/L (ref 98–111)
Creatinine, Ser: 1.04 mg/dL (ref 0.61–1.24)
GFR, Estimated: >60 mL/min (ref >60)
Glucose, Bld: 100 mg/dL — ABNORMAL HIGH (ref 70–99)
Potassium: 3.5 mmol/L (ref 3.5–5.1)
Sodium: 138 mmol/L (ref 135–145)

## 2022-12-15 LAB — CBC
HCT: 39.4 % (ref 39.0–52.0)
Hemoglobin: 14.1 g/dL (ref 13.0–17.0)
MCH: 30.9 pg (ref 26.0–34.0)
MCHC: 35.8 g/dL (ref 30.0–36.0)
MCV: 86.2 fL (ref 80.0–100.0)
Platelets: 189 10*3/uL (ref 150–400)
RBC: 4.57 MIL/uL (ref 4.22–5.81)
RDW: 13.2 % (ref 11.5–15.5)
WBC: 11.6 10*3/uL — ABNORMAL HIGH (ref 4.0–10.5)
nRBC: 0 % (ref 0.0–0.2)

## 2022-12-15 MED ORDER — APIXABAN 2.5 MG PO TABS: 5.0000 mg | ORAL_TABLET | Freq: Two times a day (BID) | ORAL | Status: DC

## 2022-12-15 MED ORDER — HEPARIN (PORCINE) 25000 UT/250ML-% IV SOLN: 1400.0000 [IU]/h | INTRAVENOUS | Status: DC

## 2022-12-15 MED ORDER — APIXABAN 2.5 MG PO TABS
10.0000 mg | ORAL_TABLET | Freq: Two times a day (BID) | ORAL | Status: DC
  Administered 2022-12-15: 10 mg via ORAL
  Filled 2022-12-15: qty 4

## 2022-12-15 MED ORDER — APIXABAN (ELIQUIS) VTE STARTER PACK (10MG AND 5MG)
ORAL_TABLET | ORAL | 0 refills | Status: DC
  Filled 2022-12-15: qty 74, 30d supply, fill #0

## 2022-12-15 MED ORDER — HEPARIN BOLUS VIA INFUSION: 5000.0000 [IU] | Freq: Once | INTRAVENOUS | Status: DC

## 2022-12-15 MED ORDER — IOHEXOL 350 MG/ML SOLN
80.0000 mL | Freq: Once | INTRAVENOUS | Status: AC | PRN
  Administered 2022-12-15: 80 mL via INTRAVENOUS

## 2022-12-15 NOTE — Progress Notes (Signed)
12/15/2022 PCCM note  Spoke with EDP Here with incidentally found PE after nonresolving PNA (symptoms started weeks ago) Normal vitals. PE burden small. Okay for [facility] and outpatient f/u He should call pulm office if hemoptysis gets worse.  Erskine Emery MD PCCM

## 2022-12-15 NOTE — Progress Notes (Addendum)
ANTICOAGULATION CONSULT NOTE - Initial Consult  Pharmacy Consult for heparin Indication: pulmonary embolus  Allergies  Allergen Reactions   Peanut-Containing Drug Products Anaphylaxis    All nuts   Mustard [Allyl Isothiocyanate] Other (See Comments)    other   Hydrocodone Itching    Pt clarified only itching, NOT hives    Patient Measurements: Height: 6' (182.9 cm) Weight: 83.5 kg (184 lb 1.4 oz) IBW/kg (Calculated) : 77.6 Heparin Dosing Weight: 83.5kg  Vital Signs: Temp: 98.3 F (36.8 C) (12/28 0709) Temp Source: Oral (12/28 0709) BP: 145/83 (12/28 0845) Pulse Rate: 74 (12/28 0845)  Labs: Recent Labs    12/15/22 0748  HGB 14.1  HCT 39.4  PLT 189  CREATININE 1.04    Estimated Creatinine Clearance: 78.8 mL/min (by C-G formula based on SCr of 1.04 mg/dL).   Medical History: Past Medical History:  Diagnosis Date   Allergic rhinitis    Arthritis    Childhood asthma    Closed head injury with concussion    multiple times secondary to sports/rock climbing   Foot drop, right    Gallstones    GERD (gastroesophageal reflux disease)    Numbness and tingling    right leg    Pneumonia    last episode 4 to 5 years ago    Pulmonary embolism ([facility])    right lung   Rupture of right triceps tendon    Solitary pulmonary nodule 02/21/13   CT-PA performed 02/21/13. Enlarging on repeat CT scan 02/26/14. PET performed 02/28/14.    Staph infection    right knee    Tinnitus    Vertigo     Medications:  Infusions:   heparin      Assessment: 46 yom presented to the ED with cough. Found to have a PE and now initiating IV heparin. Baseline CBC is WNL. He is not on anticoagulation PTA.   Goal of Therapy:  Heparin level 0.3-0.7 units/ml Monitor platelets by anticoagulation protocol: Yes   Plan:  Heparin bolus 5000 units IV x 1 Heparin gtt 1400 units/hr Check a 6 hr heparin level Daily heparin level and CBC  Djuna Frechette, Rande Lawman 12/15/2022,9:07  AM  Addendum: Changing to apixaban '10mg'$  BID x 7 days then '5mg'$  PO BID thereafter.  Salome Arnt, PharmD, BCPS, BCEMP Clinical Pharmacist Please see AMION for all pharmacy numbers 12/15/2022 9:20 AM

## 2022-12-15 NOTE — Discharge Instructions (Addendum)
Start taking the Eliquis as prescribed to treat your pulmonary embolism.  Follow-up with Dr. Gwenlyn Perking in the pulmonary clinic.  They should call you with the scheduled appointment.  Give the office a call if you do not hear from them within a few days.  Return to the ER for shortness of breath worsening symptoms  Information on my medicine - ELIQUIS (apixaban)  This medication education was reviewed with me or my healthcare representative as part of my discharge preparation.   Why was Eliquis prescribed for you? Eliquis was prescribed to treat blood clots that may have been found in the veins of your legs (deep vein thrombosis) or in your lungs (pulmonary embolism) and to reduce the risk of them occurring again.  What do You need to know about Eliquis ? The starting dose is 10 mg (two 5 mg tablets) taken TWICE daily for the FIRST SEVEN (7) DAYS, then the dose is reduced to ONE 5 mg tablet taken TWICE daily.  Eliquis may be taken with or without food.   Try to take the dose about the same time in the morning and in the evening. If you have difficulty swallowing the tablet whole please discuss with your pharmacist how to take the medication safely.  Take Eliquis exactly as prescribed and DO NOT stop taking Eliquis without talking to the doctor who prescribed the medication.  Stopping may increase your risk of developing a new blood clot.  Refill your prescription before you run out.  After discharge, you should have regular check-up appointments with your healthcare provider that is prescribing your Eliquis.    What do you do if you miss a dose? If a dose of ELIQUIS is not taken at the scheduled time, take it as soon as possible on the same day and twice-daily administration should be resumed. The dose should not be doubled to make up for a missed dose.  Important Safety Information A possible side effect of Eliquis is bleeding. You should call your healthcare provider right away if you  experience any of the following: Bleeding from an injury or your nose that does not stop. Unusual colored urine (red or dark brown) or unusual colored stools (red or black). Unusual bruising for unknown reasons. A serious fall or if you hit your head (even if there is no bleeding).  Some medicines may interact with Eliquis and might increase your risk of bleeding or clotting while on Eliquis. To help avoid this, consult your healthcare provider or pharmacist prior to using any new prescription or non-prescription medications, including herbals, vitamins, non-steroidal anti-inflammatory drugs (NSAIDs) and supplements.  This website has more information on Eliquis (apixaban): http://www.eliquis.com/eliquis/home

## 2022-12-15 NOTE — ED Provider Notes (Signed)
[facility] Provider Note   CSN: [id number] Arrival date & time: 12/15/22  0703     History  Chief Complaint  Patient presents with   Cough    Jeffrey Adkins is a 66 y.o. male.   Cough    Patient has a history of pulmonary embolism, pulmonary nodules, sarcoidosis and has been having trouble with a persistent cough.  Patient states he has had a cough ongoing now for over a month.  He has taken a few rounds of antibiotics.  He has also taken steroids.  Nothing is really seem to help.  He was tested for COVID recently and it was negative.  Patient states he has recently noticed some blood in his sputum.  He was also also having some intermittent pain in his chest.  Patient has history of prior PE and he was concerned because the symptoms started to seem familiar to that.  Home Medications Prior to Admission medications   Medication Sig Start Date End Date Taking? Authorizing Provider  APIXABAN (ELIQUIS) VTE STARTER PACK ('10MG'$  AND '5MG'$ ) Take as directed on package: start with two-'5mg'$  tablets twice daily for 7 days. On day 8, switch to one-'5mg'$  tablet twice daily. 12/15/22  Yes Dorie Rank, MD  EPINEPHrine 0.3 mg/0.3 mL IJ SOAJ injection Inject into the muscle once.    [provider]  famotidine (PEPCID) 10 MG tablet Take 10 mg by mouth daily as needed.     [provider]  omeprazole (PRILOSEC) 20 MG capsule Take 2 capsules (40 mg total) by mouth daily. 04/20/21   Horton, Barbette Hair, MD      Allergies    Peanut-containing drug products, Madelaine Bhat isothiocyanate], and Hydrocodone    Review of Systems   Review of Systems  Respiratory:  Positive for cough.     Physical Exam Updated Vital Signs BP (!) 145/83 (BP Location: Right Arm)   Pulse 74   Temp 98.3 F (36.8 C) (Oral)   Resp (!) 22   Ht 1.829 m (6')   Wt 83.5 kg   SpO2 95%   BMI 24.97 kg/m  Physical Exam Vitals and nursing note reviewed.  Constitutional:       General: He is not in acute distress.    Appearance: He is well-developed.  HENT:     Head: Normocephalic and atraumatic.     Right Ear: External ear normal.     Left Ear: External ear normal.  Eyes:     General: No scleral icterus.       Right eye: No discharge.        Left eye: No discharge.     Conjunctiva/sclera: Conjunctivae normal.  Neck:     Trachea: No tracheal deviation.  Cardiovascular:     Rate and Rhythm: Normal rate and regular rhythm.  Pulmonary:     Effort: Pulmonary effort is normal. No respiratory distress.     Breath sounds: Normal breath sounds. No stridor. No wheezing or rales.  Abdominal:     General: Bowel sounds are normal. There is no distension.     Palpations: Abdomen is soft.     Tenderness: There is no abdominal tenderness. There is no guarding or rebound.  Musculoskeletal:        General: No tenderness or deformity.     Cervical back: Neck supple.  Skin:    General: Skin is warm and dry.     Findings: No rash.  Neurological:     General:  No focal deficit present.     Mental Status: He is alert.     Cranial Nerves: No cranial nerve deficit, dysarthria or facial asymmetry.     Sensory: No sensory deficit.     Motor: No abnormal muscle tone or seizure activity.     Coordination: Coordination normal.  Psychiatric:        Mood and Affect: Mood normal.     ED Results / Procedures / Treatments   Labs (all labs ordered are listed, but only abnormal results are displayed) Labs Reviewed  CBC - Abnormal; Notable for the following components:      Result Value   WBC 11.6 (*)    All other components within normal limits  BASIC METABOLIC PANEL - Abnormal; Notable for the following components:   Glucose, Bld 100 (*)    All other components within normal limits    EKG None  Radiology CT Angio Chest PE W and/or Wo Contrast  Result Date: 12/15/2022 CLINICAL DATA:  One-month history of cough with intermittent hematemesis now with shortness of  breath and pain in the left posterior chest EXAM: CT ANGIOGRAPHY CHEST WITH CONTRAST TECHNIQUE: Multidetector CT imaging of the chest was performed using the standard protocol during bolus administration of intravenous contrast. Multiplanar CT image reconstructions and MIPs were obtained to evaluate the vascular anatomy. RADIATION DOSE REDUCTION: This exam was performed according to the departmental dose-optimization program which includes automated exposure control, adjustment of the mA and/or kV according to patient size and/or use of iterative reconstruction technique. CONTRAST:  26m OMNIPAQUE IOHEXOL 350 MG/ML SOLN COMPARISON:  CT chest dated 05/31/2021 FINDINGS: Cardiovascular: The study is high quality for the evaluation of pulmonary embolism. Filling defect within the distal left lower lobar artery extending into the lateral and posterior segmental arteries. Great vessels are normal in course and caliber. Normal heart size. No significant pericardial fluid/thickening. Mediastinum/Nodes: Partially imaged thyroid gland without nodules meeting criteria for imaging follow-up by size. Normal esophagus. No pathologically enlarged axillary, supraclavicular, mediastinal, or hilar lymph nodes. Lungs/Pleura: The central airways are patent. New ground-glass opacity in the posterior left lower lobe distal to the pulmonary emboli. Persistent peribronchovascular soft tissue thickening involving all lobes with decreased but persistent irregular right upper lobe nodules, no longer discretely measurable. No pneumothorax. Small left pleural effusion. Upper abdomen: Cholelithiasis. Musculoskeletal: No acute or abnormal lytic or blastic osseous lesions. Review of the MIP images confirms the above findings. IMPRESSION: 1. Acute pulmonary embolism within the distal left lower lobar artery extending into the lateral and posterior segmental arteries. New ground-glass opacity in the posterior left lower lobe distal to the pulmonary  emboli, suspicious for pulmonary infarct. No findings of right heart strain. 2. Small left pleural effusion. 3. Persistent peribronchovascular soft tissue thickening involving all lobes with decreased but persistent irregular right upper lobe nodules, no longer discretely measurable. 4. Cholelithiasis. Critical Value/emergent results were called by telephone at the time of interpretation on 12/15/2022 at 8:55 am [facility] verbally acknowledged these results. Electronically Signed   By: LDarrin NipperM.D.   On: 12/15/2022 08:59   DG Chest 2 View  Result Date: 12/15/2022 CLINICAL DATA:  Cough for 30 days. Hemoptysis. Shortness of breath with exertion. EXAM: CHEST - 2 VIEW COMPARISON:  Two-view chest x-ray 04/20/2021.  CT chest 05/31/2021 FINDINGS: Heart size is upper limits of normal, exaggerated by low lung volumes. Nodular density in the right upper lobe is similar to slightly decreased from the prior chest  x-ray. Posterior lower lobe airspace disease is new. Subtle opacities are present in the right middle lobe or lingula. No significant effusion is present. Visualized soft tissues and bony thorax are unremarkable. IMPRESSION: 1. New posterior lower lobe airspace disease concerning for pneumonia. 2. Subtle opacities in the right middle lobe or lingula. 3. Nodular density in the right upper lobe is similar to slightly decreased from the prior chest x-ray. Electronically Signed   By: San Morelle M.D.   On: 12/15/2022 07:36    Procedures Procedures    Medications Ordered in ED Medications  apixaban (ELIQUIS) tablet 10 mg (has no administration in time range)    Followed by  apixaban (ELIQUIS) tablet 5 mg (has no administration in time range)  iohexol (OMNIPAQUE) 350 MG/ML injection 80 mL (80 mLs Intravenous Contrast Given 12/15/22 3810)    ED Course/ Medical Decision Making/ A&P Clinical Course as of 12/15/22 1751  Thu Dec 15, 2022  0827 CBC(!) Mild leukocytosis noted [JK]   0258 Basic metabolic panel(!) No significant metabolic abnormalities [JK]  0828 DG Chest 2 View X-ray suggest possible pneumonia [JK]  0858 CT shows PE and infarct. [NI]  7782 Reviewed findings with patient.  He states he is not feeling short of breath.  Is otherwise feeling well.  Asks if we could possibly treat as outpatient.  He does have evidence of pulmonary infarct but no evidence of heart strain.  I will consult with pulmonology to get their opinion.  .  Patient has followed up with  Dr. Elsworth Soho in the past [JK]  0912 Case discussed with Dr. Tamala Julian, pulmonary critical care.  He does feel the patient is appropriate for outpatient management of his pulmonary embolism.  We will start him on Eliquis.  Dr. Tamala Julian will arrange for follow-up in the pulmonary clinic [JK]    Clinical Course User Index [JK] Dorie Rank, MD                           Medical Decision Making Amount and/or Complexity of Data Reviewed Labs: ordered. Decision-making details documented in ED Course. Radiology: ordered and independent interpretation performed. Decision-making details documented in ED Course.  Risk Prescription drug management.   Patient presented to the ED for dilation of persistent cough.  Initial chest x-ray suggested the possibility of pneumonia however patient was concerned about PE.  He does have a history of prior PE.  CT angio performed and it does confirm a pulmonary embolism with possible pulmonary infarct.  Patient however does not have an oxygen requirement.  He is not tachycardic he is not tachypneic.  He is not having any severe pain or discomfort.  Patient is interested in possible outpatient management.  I discussed the case with Dr. Tamala Julian pulmonary critical care and does feel the patient is an appropriate outpatient candidate.  Will discharge home with a prescription for Eliquis.  Patient was provided a dose here in the ED.  He will follow-up in the pulmonary critical care clinic for further  evaluation.        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

## 2022-12-15 NOTE — ED Triage Notes (Addendum)
States has had a strong cough for the past 30 days, states having some bright red blood at times. No fevers. States has been on abx, but no relief and also steroids, last dose of steroids yesterday, day 6. Covid tests have been negative

## 2022-12-15 NOTE — Telephone Encounter (Signed)
Patient is scheduled 1/12 at 11:30am with Roxan Diesel, NP- reminder mailed to address on file. Nothing further needed.

## 2022-12-30 ENCOUNTER — Ambulatory Visit (INDEPENDENT_AMBULATORY_CARE_PROVIDER_SITE_OTHER)
Admission: RE | Admit: 2022-12-30 | Discharge: 2022-12-30 | Disposition: A | Discharge status: Home / Self Care | Payer: BC Managed Care – PPO | Source: Ambulatory Visit | Attending: Nurse Practitioner | Admitting: Nurse Practitioner

## 2022-12-30 ENCOUNTER — Ambulatory Visit: Payer: BC Managed Care – PPO | Admitting: Nurse Practitioner

## 2022-12-30 ENCOUNTER — Encounter: Payer: Self-pay | Admitting: Nurse Practitioner

## 2022-12-30 VITALS — BP 130/80 | HR 72 | Ht 71.0 in | Wt 182.4 lb

## 2022-12-30 DIAGNOSIS — R059 Cough, unspecified: Secondary | ICD-10-CM | POA: Diagnosis not present

## 2022-12-30 DIAGNOSIS — D869 Sarcoidosis, unspecified: Secondary | ICD-10-CM | POA: Diagnosis not present

## 2022-12-30 DIAGNOSIS — I2699 Other pulmonary embolism without acute cor pulmonale: Secondary | ICD-10-CM | POA: Diagnosis not present

## 2022-12-30 MED ORDER — PROMETHAZINE-DM 6.25-15 MG/5ML PO SYRP: 5.0000 mL | ORAL_SOLUTION | Freq: Four times a day (QID) | ORAL | 0 refills | Status: AC | PRN

## 2022-12-30 MED ORDER — BENZONATATE 200 MG PO CAPS: 200.0000 mg | ORAL_CAPSULE | Freq: Three times a day (TID) | ORAL | 1 refills | Status: AC | PRN

## 2022-12-30 MED ORDER — APIXABAN 5 MG PO TABS: 5.0000 mg | ORAL_TABLET | Freq: Two times a day (BID) | ORAL | 5 refills | Status: DC

## 2022-12-30 NOTE — Progress Notes (Signed)
Notified pt of CXR findings. Previous changes noted in left lung improved. Persistent chronic right upper lobe opacity, unchanged. We will hold off on any further abx at this time as he has been treated with three courses recently. Verbalized understanding.

## 2022-12-30 NOTE — Assessment & Plan Note (Signed)
Presumed based on imaging. Decreased on recent imaging. He would like to hold off on annual imaging. We will move forward with watchful waiting.

## 2022-12-30 NOTE — Progress Notes (Signed)
$'@Patient'k$  ID: Jeffrey Adkins, male    DOB: 1956-07-23, 67 y.o.   MRN: [id number]  Chief Complaint  Patient presents with   Follow-up    Pulmonary.    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  He has a history of childhood asthma and unprovoked pulmonary embolism in 2014, which she was treated with Xarelto for 4 months 4.  He is a patient Dr. Bari Mantis and last seen in office 06/02/2020.  Past medical history significant for thyroid nodule, GERD.  He was found to have a 6 mm right upper lobe nodule on CT scan when the PE was diagnosed 02/2013.  He had repeat CT scan 02/26/2014 and PET scan 02/28/2014 that showed the nodule to be increased in size, slightly hypermetabolic on PET and also hypermetabolic mediastinal nodes which were borderline enlarged.  Repeat imaging from July 2018 showed increased right upper lobe nodule, 13 mm, and left lower lobe 9 mm nodule which was also increased.  He was set up for CT-guided Adkins biopsy of right upper lobe nodule which was completed on July 24, 2017 and mediast[facility]oscopy on 08/04/2017 at [facility] by Dr. Baldemar Friday.  Pathology showed necrotizing and nonnecrotizing granulomas, fungal stains and cultures negative.  DNA probes were negative for my bacteria, bacteria and fungus.  Serial imaging revealed new left lower lobe 2.5 cm nodule in 2019, which then decreased in size over time.  Lung function also decreased so he was treated with low-dose prednisone for 2 to 3 months without symptomatic benefit.  He underwent repeat PET scan in June 2021 with increasing size and number of pulmonary nodules, particularly in the right chest but with bronchovascular distribution.  There is increasing metabolic activity particularly in the right upper lobe nodule.  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  He was symptomatically stable so plan was to repeat imaging  yearly.  He went to the emergency room on 11/25/2022.  He had had a cough for going on a month.  Was treated with multiple rounds of antibiotics and steroids.  No improvement and developed hemoptysis so he decided to seek further evaluation.  CTA chest revealed a acute PE within the distal left lower lobe extending into the lateral and posterior segmental arteries.  There is also a new groundglass opacity in the posterior left lower lobe, suspicious for pulmonary infarct.  Vital signs were stable and he did not have any oxygen requirement so he was discharged home on Eliquis with plans to follow-up with pulmonary outpatient.  TEST/EVENTS:  12/15/2022 CTA chest: acute PE in distal left lower lobe extending into lateral and posterior segmental arteries. Ground glass opacity in LLL, suspicious for pulmonary infarct. No evidence of heart strain. Small left pleural effusion. Persistent peribronchovascular soft tissue thickening involving all lobes, decreased but persistent irregular right upper lobe nodules.  12/30/2022: Today-follow-up Patient presents today for hospital follow-up.  He was diagnosed with acute PE on 12/28, started on Eliquis.  He has completed starter pack and is on 5 mg twice daily.  No excessive bleeding or bruising.  No significant side effects.  This is his second pulmonary embolism and considered unprovoked.  No evidence of right heart strain on imaging.  Dopplers were not obtained.  He tells me today that he is slowly feeling better but is still feeling fatigued and having some episodes of hemoptysis.  They seem to be occurring less frequent.  Feels like his  cough has become more productive as the hemoptysis has reduced. White to green sputum. Still having some pleuritic pain. Denies fevers, chills, loss of appetite. He is eating and drinking well. Breathing seems to be improving. He had some trouble with orthopnea when he left the hospital but this has resolved.  He is usually followed for  suspected sarcoidosis. He was advised yearly CT scans but has not been seen since 2021. His CTA did show decreased nodularity, primarily in right lung. He does not want to continue with yearly screening until after he is 57, unless his symptoms change.   Allergies  Allergen Reactions   Peanut-Containing Drug Products Anaphylaxis    All nuts   Mustard [Allyl Isothiocyanate] Other (See Comments)    other   Hydrocodone Itching    Pt clarified only itching, NOT hives    Immunization History  Administered Date(s) Administered   Moderna Sars-Covid-2 Vaccination 01/21/2020, 02/17/2020   PPD Test 02/21/2013    Past Medical History:  Diagnosis Date   Allergic rhinitis    Arthritis    Childhood asthma    Closed head injury with concussion    multiple times secondary to sports/rock climbing   Foot drop, right    Gallstones    GERD (gastroesophageal reflux disease)    Numbness and tingling    right leg    Pneumonia    last episode 4 to 5 years ago    Pulmonary embolism (HCC)    right lung   Rupture of right triceps tendon    Solitary pulmonary nodule 02/21/13   CT-PA performed 02/21/13. Enlarging on repeat CT scan 02/26/14. PET performed 02/28/14.    Staph infection    right knee    Tinnitus    Vertigo     Tobacco History: Social History   Tobacco Use  Smoking Status Never  Smokeless Tobacco Never   Counseling given: Not Answered   Outpatient Medications Prior to Visit  Medication Sig Dispense Refill   EPINEPHrine 0.3 mg/0.3 mL IJ SOAJ injection Inject into the muscle once.     famotidine (PEPCID) 10 MG tablet Take 10 mg by mouth daily as needed.      omeprazole (PRILOSEC) 20 MG capsule Take 2 capsules (40 mg total) by mouth daily. 60 capsule 0   APIXABAN (ELIQUIS) VTE STARTER PACK ('10MG'$  AND '5MG'$ ) Take as directed on package: start with two-'5mg'$  tablets twice daily for 7 days. On day 8, switch to one-'5mg'$  tablet twice daily. 74 tablet 0   No facility-administered medications  prior to visit.     Review of Systems:   Constitutional: No weight loss or gain, night sweats, fevers, chills, or lassitude. +fatigue HEENT: No headaches, difficulty swallowing, tooth/dental problems, or sore throat. No sneezing, itching, ear ache, nasal congestion, or post nasal drip CV:  No chest pain, orthopnea, PND, swelling in lower extremities, anasarca, dizziness, palpitations, syncope Resp: +productive cough; pleuritic pain; hemoptysis (improving). No shortness of breath with exertion or at rest. No wheezing.  No chest wall deformity GI:  No heartburn, indigestion, abdominal pain, nausea, vomiting, diarrhea, change in bowel habits, loss of appetite, bloody stools.  Skin: No rash, lesions, ulcerations MSK:  No joint pain or swelling. Neuro: No dizziness or lightheadedness.  Psych: No depression or anxiety. Mood stable.     Physical Exam:  BP 130/80 (BP Location: Right Arm)   Pulse 72   Ht '5\' 11"'$  (1.803 m)   Wt 182 lb 6.4 oz (82.7 kg)   SpO2 98%  BMI 25.44 kg/m   GEN: Pleasant, interactive, well-appearing; in no acute distress. HEENT:  Normocephalic and atraumatic. PERRLA. Sclera white. Nasal turbinates pink, moist and patent bilaterally. No rhinorrhea present. Oropharynx pink and moist, without exudate or edema. No lesions, ulcerations, or postnasal drip.  NECK:  Supple w/ fair ROM. No JVD present. Normal carotid impulses w/o bruits. Thyroid symmetrical with no goiter or nodules palpated. No lymphadenopathy.   CV: RRR, no m/r/g, no peripheral edema. Pulses intact, +2 bilaterally. No cyanosis, pallor or clubbing. PULMONARY:  Unlabored, regular breathing. Clear bilaterally A&P w/o wheezes/rales/rhonchi. No accessory muscle use.  GI: BS present and normoactive. Soft, non-tender to palpation. No organomegaly or masses detected.  MSK: No erythema, warmth or tenderness. Cap refil <2 sec all extrem. No deformities or joint swelling noted.  Neuro: A/Ox3. No focal deficits noted.    Skin: Warm, no lesions or rashe Psych: Normal affect and behavior. Judgement and thought content appropriate.     Lab Results:  CBC    Component Value Date/Time   WBC 11.6 (H) 12/15/2022 0748   RBC 4.57 12/15/2022 0748   HGB 14.1 12/15/2022 0748   HGB 14.9 03/13/2014 1020   HCT 39.4 12/15/2022 0748   HCT 44.2 03/13/2014 1020   PLT 189 12/15/2022 0748   PLT 187 03/13/2014 1020   MCV 86.2 12/15/2022 0748   MCV 88 03/13/2014 1020   MCH 30.9 12/15/2022 0748   MCHC 35.8 12/15/2022 0748   RDW 13.2 12/15/2022 0748   RDW 13.1 03/13/2014 1020   LYMPHSABS 1.3 04/20/2021 0244   LYMPHSABS 1.0 03/13/2014 1020   MONOABS 0.8 04/20/2021 0244   MONOABS 0.6 03/13/2014 1020   EOSABS 0.1 04/20/2021 0244   EOSABS 0.1 03/13/2014 1020   BASOSABS 0.1 04/20/2021 0244   BASOSABS 0.0 03/13/2014 1020    BMET    Component Value Date/Time   NA 138 12/15/2022 0748   NA 141 03/13/2014 1020   K 3.5 12/15/2022 0748   K 4.5 03/13/2014 1020   CL 103 12/15/2022 0748   CL 103 03/13/2014 1020   CO2 28 12/15/2022 0748   CO2 31 03/13/2014 1020   GLUCOSE 100 (H) 12/15/2022 0748   GLUCOSE 87 03/13/2014 1020   BUN 14 12/15/2022 0748   BUN 29 (H) 03/13/2014 1020   CREATININE 1.04 12/15/2022 0748   CREATININE 1.15 03/13/2014 1020   CALCIUM 9.3 12/15/2022 0748   CALCIUM 9.3 03/13/2014 1020   GFRNONAA >60 12/15/2022 0748   GFRNONAA >60 03/13/2014 1020   GFRAA >60 04/23/2019 1950   GFRAA >60 03/13/2014 1020    BNP No results found for: "BNP"   Imaging:  DG Chest 2 View  Result Date: 12/30/2022 CLINICAL DATA:  Cough.  Recent pulmonary embolus. EXAM: CHEST - 2 VIEW COMPARISON:  12/15/2022 FINDINGS: The cardiomediastinal silhouette is unremarkable. There is no evidence of new pulmonary opacities, pleural effusion or pneumothorax. Streaky/nodular RIGHT UPPER lobe opacity is unchanged. No acute bony abnormalities are identified. IMPRESSION: 1. No evidence of acute cardiopulmonary disease.  Electronically Signed   By: Margarette Canada M.D.   On: 12/30/2022 12:21   CT Angio Chest PE W and/or Wo Contrast  Result Date: 12/15/2022 CLINICAL DATA:  One-month history of cough with intermittent hematemesis now with shortness of breath and pain in the left posterior chest EXAM: CT ANGIOGRAPHY CHEST WITH CONTRAST TECHNIQUE: Multidetector CT imaging of the chest was performed using the standard protocol during bolus administration of intravenous contrast. Multiplanar CT image reconstructions and MIPs were  obtained to evaluate the vascular anatomy. RADIATION DOSE REDUCTION: This exam was performed according to the departmental dose-optimization program which includes automated exposure control, adjustment of the mA and/or kV according to patient size and/or use of iterative reconstruction technique. CONTRAST:  59m OMNIPAQUE IOHEXOL 350 MG/ML SOLN COMPARISON:  CT chest dated 05/31/2021 FINDINGS: Cardiovascular: The study is high quality for the evaluation of pulmonary embolism. Filling defect within the distal left lower lobar artery extending into the lateral and posterior segmental arteries. Great vessels are normal in course and caliber. Normal heart size. No significant pericardial fluid/thickening. Mediastinum/Nodes: Partially imaged thyroid gland without nodules meeting criteria for imaging follow-up by size. Normal esophagus. No pathologically enlarged axillary, supraclavicular, mediastinal, or hilar lymph nodes. Lungs/Pleura: The central airways are patent. New ground-glass opacity in the posterior left lower lobe distal to the pulmonary emboli. Persistent peribronchovascular soft tissue thickening involving all lobes with decreased but persistent irregular right upper lobe nodules, no longer discretely measurable. No pneumothorax. Small left pleural effusion. Upper abdomen: Cholelithiasis. Musculoskeletal: No acute or abnormal lytic or blastic osseous lesions. Review of the MIP images confirms the above  findings. IMPRESSION: 1. Acute pulmonary embolism within the distal left lower lobar artery extending into the lateral and posterior segmental arteries. New ground-glass opacity in the posterior left lower lobe distal to the pulmonary emboli, suspicious for pulmonary infarct. No findings of right heart strain. 2. Small left pleural effusion. 3. Persistent peribronchovascular soft tissue thickening involving all lobes with decreased but persistent irregular right upper lobe nodules, no longer discretely measurable. 4. Cholelithiasis. Critical Value/emergent results were called by telephone at the time of interpretation on 12/15/2022 at 8:55 am to provider [facility], who verbally acknowledged these results. Electronically Signed   By: LDarrin NipperM.D.   On: 12/15/2022 08:59   DG Chest 2 View  Result Date: 12/15/2022 CLINICAL DATA:  Cough for 30 days. Hemoptysis. Shortness of breath with exertion. EXAM: CHEST - 2 VIEW COMPARISON:  Two-view chest x-ray 04/20/2021.  CT chest 05/31/2021 FINDINGS: Heart size is upper limits of normal, exaggerated by low lung volumes. Nodular density in the right upper lobe is similar to slightly decreased from the prior chest x-ray. Posterior lower lobe airspace disease is new. Subtle opacities are present in the right middle lobe or lingula. No significant effusion is present. Visualized soft tissues and bony thorax are unremarkable. IMPRESSION: 1. New posterior lower lobe airspace disease concerning for pneumonia. 2. Subtle opacities in the right middle lobe or lingula. 3. Nodular density in the right upper lobe is similar to slightly decreased from the prior chest x-ray. Electronically Signed   By: CSan MorelleM.D.   On: 12/15/2022 07:36          No data to display          No results found for: "NITRICOXIDE"      Assessment & Plan:   Pulmonary embolism Acute PE diagnosed 12/28. He was treated outpatient with oral anticoagulation. No evidence of right  heart strain on imaging and breathing is improving. He is still having some hemoptysis, overall better, and increased productive cough. He did have ground glass opacity in LLL, which was felt to be pulmonary infarct. We will obtain CXR today to rule out increased pleural effusion and worsening/superimposed infection. Suspect unresolved hemoptysis is related to airway irritation - cough control measures advised. Close follow up and strict return precautions.  Patient Instructions  Continue Eliquis 5 mg Twice daily. If you fall or have  an injury where you hit your head or you develop severe sudden headaches, go to the emergency department. Monitor for any excessive bruising or bleeding   Benzonatate 1 capsule Three times a day for cough - take consistently over the next 3-4 days then as needed for cough Promethazine DM cough syrup 5 mL every 6 hours as needed for severe coughing. Do not take over the counter cough syrups with this. May cause drowsiness. Avoid driving after taking  Guaifenesin [phone] mg Twice daily as needed for chest congestion  Chest x ray today  Referral to hematology   Follow up in two weeks with Dr. Elsworth Soho or Joellen Jersey Monette Omara,NP to see how cough is doing. If symptoms do not improve or worsen, please contact office for sooner follow up or seek emergency care.    Sarcoidosis Presumed based on imaging. Decreased on recent imaging. He would like to hold off on annual imaging. We will move forward with watchful waiting.   I spent 35 minutes of dedicated to the care of this patient on the date of this encounter to include pre-visit review of records, face-to-face time with the patient discussing conditions above, post visit ordering of testing, clinical documentation with the electronic health record, making appropriate referrals as documented, and communicating necessary findings to members of the patients care team.  Clayton Bibles, NP 12/30/2022  Pt aware and understands NP's role.

## 2022-12-30 NOTE — Patient Instructions (Addendum)
Continue Jeffrey Adkins 5 mg Twice daily. If you fall or have an injury where you hit your head or you develop severe sudden headaches, go to the emergency department. Monitor for any excessive bruising or bleeding   Benzonatate 1 capsule Three times a day for cough - take consistently over the next 3-4 days then as needed for cough Promethazine DM cough syrup 5 mL every 6 hours as needed for severe coughing. Do not take over the counter cough syrups with this. May cause drowsiness. Avoid driving after taking  Guaifenesin [phone] mg Twice daily as needed for chest congestion  Chest x ray today  Referral to hematology   Follow up in two weeks with Jeffrey Adkins or Jeffrey Jersey [name],Jeffrey Adkins to see how cough is doing. If symptoms do not improve or worsen, please contact office for sooner follow up or seek emergency care.

## 2022-12-30 NOTE — Assessment & Plan Note (Addendum)
Acute PE diagnosed 12/28. He was treated outpatient with oral anticoagulation. No evidence of right heart strain on imaging and breathing is improving. He is still having some hemoptysis, overall better, and increased productive cough. He did have ground glass opacity in LLL, which was felt to be pulmonary infarct. We will obtain CXR today to rule out increased pleural effusion and worsening/superimposed infection. Suspect unresolved hemoptysis is related to airway irritation - cough control measures advised. Close follow up and strict return precautions.  Patient Instructions  Continue Eliquis 5 mg Twice daily. If you fall or have an injury where you hit your head or you develop severe sudden headaches, go to the emergency department. Monitor for any excessive bruising or bleeding   Benzonatate 1 capsule Three times a day for cough - take consistently over the next 3-4 days then as needed for cough Promethazine DM cough syrup 5 mL every 6 hours as needed for severe coughing. Do not take over the counter cough syrups with this. May cause drowsiness. Avoid driving after taking  Guaifenesin [phone] mg Twice daily as needed for chest congestion  Chest x ray today  Referral to hematology   Follow up in two weeks with Dr. Elsworth Soho or Joellen Jersey Tabbitha Janvrin,NP to see how cough is doing. If symptoms do not improve or worsen, please contact office for sooner follow up or seek emergency care.

## 2023-01-18 ENCOUNTER — Inpatient Hospital Stay: Payer: BC Managed Care – PPO | Attending: Hematology and Oncology | Admitting: Hematology and Oncology

## 2023-01-18 ENCOUNTER — Inpatient Hospital Stay: Payer: BC Managed Care – PPO

## 2023-01-18 VITALS — BP 145/83 | HR 86 | Temp 97.5°F | Resp 14 | Wt 182.3 lb

## 2023-01-18 DIAGNOSIS — Z7901 Long term (current) use of anticoagulants: Secondary | ICD-10-CM | POA: Insufficient documentation

## 2023-01-18 DIAGNOSIS — I2699 Other pulmonary embolism without acute cor pulmonale: Secondary | ICD-10-CM | POA: Diagnosis not present

## 2023-01-18 DIAGNOSIS — I2694 Multiple subsegmental pulmonary emboli without acute cor pulmonale: Secondary | ICD-10-CM

## 2023-01-18 NOTE — Progress Notes (Signed)
Jeffrey Adkins  Patient Care Team: Jeffrey Huddle, MD as PCP - General (Internal Medicine)  CHIEF COMPLAINTS/PURPOSE OF CONSULTATION:  Newly diagnosed acute pulmonary embolism with pulmonary infarction  HISTORY OF PRESENTING ILLNESS:  Jeffrey Adkins 67 y.o. male is here because of recent diagnosis of acute left lower lobe pulmonary embolism with infarction.  This happened on 12/07/2022 when he woke up with a sudden onset of stabbing chest pain and hemoptysis.  He went to the emergency room and a CT chest revealed the pulmonary embolism.  He was started on Eliquis.  He had a previous history of pulmonary embolism in 2014 after a trip from [place].  He was treated with Xarelto for 3 months.  He has not had any further problems since then until this time.  I reviewed her records extensively and collaborated the history with the patient.     MEDICAL HISTORY:  Past Medical History:  Diagnosis Date   Allergic rhinitis    Arthritis    Childhood asthma    Closed head injury with concussion    multiple times secondary to sports/rock climbing   Foot drop, right    Gallstones    GERD (gastroesophageal reflux disease)    Numbness and tingling    right leg    Pneumonia    last episode 4 to 5 years ago    Pulmonary embolism (HCC)    right lung   Rupture of right triceps tendon    Solitary pulmonary nodule 02/21/13   CT-PA performed 02/21/13. Enlarging on repeat CT scan 02/26/14. PET performed 02/28/14.    Staph infection    right knee    Tinnitus    Vertigo     SURGICAL HISTORY: Past Surgical History:  Procedure Laterality Date   amputation great right toe      ANKLE SURGERY     BACK SURGERY     EYE SURGERY     LASIK 1996    KNEE SURGERY     right knee scoped 1992; I&D 6 times;    left ring finger      secondary to tendon tear    PICC LINE PLACE PERIPHERAL ([place] HX)     history of in 1992 secondary to staph infection    TOTAL KNEE ARTHROPLASTY Right  12/18/2015   Procedure: RIGHT TOTAL KNEE ARTHROPLASTY;  Surgeon: Jeffrey Cancel, MD;  Location: [facility];  Service: Orthopedics;  Laterality: Right;   TRICEPS TENDON REPAIR Right 04/08/2016   Procedure: RIGHT TRICEPS REPAIR BURSECTOMY ;  Surgeon: Jeffrey Kaufman, MD;  Location: [place];  Service: Orthopedics;  Laterality: Right;   ULNAR NERVE TRANSPOSITION Right 04/08/2016   Procedure: ULNAR NERVE DECOMPRESSION;  Surgeon: Jeffrey Kaufman, MD;  Location: [place];  Service: Orthopedics;  Laterality: Right;    SOCIAL HISTORY: Social History   Socioeconomic History   Marital status: Married    Spouse name: Not on file   Number of children: Not on file   Years of education: Not on file   Highest education level: Not on file  Occupational History   Not on file  Tobacco Use   Smoking status: Never   Smokeless tobacco: Never  Vaping Use   Vaping Use: Never used  Substance and Sexual Activity   Alcohol use: No   Drug use: No   Sexual activity: Not on file  Other Topics Concern   Not on file  Social History Narrative   Not on file   Social Determinants of  Health   Financial Resource Strain: Not on file  Food Insecurity: Not on file  Transportation Needs: Not on file  Physical Activity: Not on file  Stress: Not on file  Social Connections: Not on file  Intimate Partner Violence: Not on file    FAMILY HISTORY: Family History  Problem Relation Age of Onset   Hypertension Mother    Sjogren's syndrome Mother    Celiac disease Sister     ALLERGIES:  is allergic to peanut-containing drug products, mustard [allyl isothiocyanate], and hydrocodone.  MEDICATIONS:  Current Outpatient Medications  Medication Sig Dispense Refill   apixaban (ELIQUIS) 5 MG TABS tablet Take 1 tablet (5 mg total) by mouth 2 (two) times daily. 60 tablet 5   benzonatate (TESSALON) 200 MG capsule Take 1 capsule (200 mg total) by mouth 3 (three) times daily as needed for cough. 30 capsule 1   EPINEPHrine 0.3 mg/0.3 mL  IJ SOAJ injection Inject into the muscle once.     famotidine (PEPCID) 10 MG tablet Take 10 mg by mouth daily as needed.      omeprazole (PRILOSEC) 20 MG capsule Take 2 capsules (40 mg total) by mouth daily. 60 capsule 0   promethazine-dextromethorphan (PROMETHAZINE-DM) 6.25-15 MG/5ML syrup Take 5 mLs by mouth 4 (four) times daily as needed for cough. 240 mL 0   No current facility-administered medications for this visit.    REVIEW OF SYSTEMS:   Constitutional: Denies fevers, chills or abnormal night sweats   All other systems were reviewed with the patient and are negative.  PHYSICAL EXAMINATION: ECOG PERFORMANCE STATUS: 1 - Symptomatic but completely ambulatory  Vitals:   01/18/23 0856  BP: (!) 145/83  Pulse: 86  Resp: 14  Temp: (!) 97.5 F (36.4 C)  SpO2: 100%   Filed Weights   01/18/23 0856  Weight: 182 lb 4.8 oz (82.7 kg)    GENERAL:alert, no distress and comfortable    LABORATORY DATA:  I have reviewed the data as listed Lab Results  Component Value Date   WBC 11.6 (H) 12/15/2022   HGB 14.1 12/15/2022   HCT 39.4 12/15/2022   MCV 86.2 12/15/2022   PLT 189 12/15/2022   Lab Results  Component Value Date   NA 138 12/15/2022   K 3.5 12/15/2022   CL 103 12/15/2022   CO2 28 12/15/2022    RADIOGRAPHIC STUDIES: I have personally reviewed the radiological reports and agreed with the findings in the report.  ASSESSMENT AND PLAN:  Pulmonary embolism 12/15/2022: CT angiogram: Acute pulmonary embolism within the distal left lower lobe pulmonary artery extending into the lateral and posterior segmental arteries.  Possible pulmonary infarct 2013: Acute pulmonary embolism treated with Xarelto for 3 months  Inherited risk factors include: 1. Factor V Leiden mutation 2. Prothrombin gene G20210A 3. Protein S deficiency  4. Protein C deficiency  5. Antithrombin deficiency  Acquired risk factors include: 1. Antiphospholipid antibody syndrome 2. Tobacco use 3.  Obesity 4. Medications including oral contraceptives 5. Sedentary behavior including postoperative state 6. Foreign bodies in circulation  Workup recommended: Bloodwork to evaluate for the 5 inherited factors mentioned above along with antiphospholipid antibodies. Return to clinic in one to 2 weeks to discuss the results of the tests He is allowed by profession and his sons are doctors.   All questions were answered. The patient knows to call the clinic with any problems, questions or concerns.    Harriette Ohara, MD 01/18/23

## 2023-01-18 NOTE — Assessment & Plan Note (Signed)
12/15/2022: CT angiogram: Acute pulmonary embolism within the distal left lower lobe pulmonary artery extending into the lateral and posterior segmental arteries.  Possible pulmonary infarct    Inherited risk factors include: 1. Factor V Leiden mutation 2. Prothrombin gene G20210A 3. Protein S deficiency  4. Protein C deficiency  5. Antithrombin deficiency  Acquired risk factors include: 1. Antiphospholipid antibody syndrome 2. Tobacco use 3. Obesity 4. Medications including oral contraceptives 5. Sedentary behavior including postoperative state 6. Foreign bodies in circulation  Workup recommended: Bloodwork to evaluate for the 5 inherited factors mentioned above along with antiphospholipid antibodies. Return to clinic in one to 2 weeks to discuss the results of the tests

## 2023-01-19 ENCOUNTER — Ambulatory Visit (HOSPITAL_BASED_OUTPATIENT_CLINIC_OR_DEPARTMENT_OTHER): Payer: BC Managed Care – PPO | Admitting: Pulmonary Disease

## 2023-01-19 ENCOUNTER — Encounter (HOSPITAL_BASED_OUTPATIENT_CLINIC_OR_DEPARTMENT_OTHER): Payer: Self-pay | Admitting: Pulmonary Disease

## 2023-01-19 VITALS — BP 118/64 | HR 86 | Temp 98.1°F | Ht 71.0 in | Wt 180.2 lb

## 2023-01-19 DIAGNOSIS — D869 Sarcoidosis, unspecified: Secondary | ICD-10-CM

## 2023-01-19 DIAGNOSIS — I2694 Multiple subsegmental pulmonary emboli without acute cor pulmonale: Secondary | ICD-10-CM

## 2023-01-19 NOTE — Patient Instructions (Addendum)
X amb sat   Cough could be post, viral, sarcoid less likely pulm infarct Take prednisone 10 mg daily x 2 weeks, then taper off  Report in 2 months if persistent symptoms

## 2023-01-19 NOTE — Assessment & Plan Note (Addendum)
Likely will need lifelong anticoagulation. Await blood work for inheritable coagulation disorders and antiphospholipid antibodies  Persistent fatigue likely residual from PE but if persist beyond the 76-monthmark, may proceed with repeat CT and PFTs

## 2023-01-19 NOTE — Progress Notes (Signed)
Subjective:    Patient ID: Jeffrey Adkins, male    DOB: 11-23-1956, 67 y.o.   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      CT chest 06/2017 showed right upper lobe nodule increased in size to 13 mm and 9 mm left lower lobe nodule which was also increased compared to prior imaging, these were both hypermetabolic on PET. PET scan also showed hypermetabolic subcarinal and hilar lymph nodes as also an external iliac, may be also a lymph node along the caudate lobe of liver. At least one of the mediastinal lymph nodes appeared to be calcified in the interim   He underwent CT-guided needle biopsy of right upper lobe nodule on 07/24/17 and [facility] Pathology showed necrotizing and non necrotizing granulomas , fungal stains and cultures negative . DNA probes were negative for mycobacteria, bacteria  and fungus.     Serial imaging showed appearance of new left lower lobe 2.5 cm nodule in 2019 which then decreased in size.  Lung function also decreased hence we treated him with low-dose prednisone for 2 to 3 months without symptomatic benefit     There was increasing gynecomastia on the right-he reports that the subcutaneous nodule in the right breast area has been present for more than 15 years and has waxed and waned over the years he had a mammogram in the remote past which was negative. Prior PET scans  show mild  hypermetabolism in the subcutaneous mass in 2015 but none in 2018  Chief Complaint  Patient presents with   Follow-up    Follow up on a Pulmonary Infarc  he had in December. Pt stays tired, cold and a productive cough since December. Pt states that he has tried Tessolon cough pearls and cough syrup without any relief.    He had an ED visit 12/28 for cough and hemoptysis. CT angiogram chest showed acute PE in the distal left lower lobe extending into lateral and posterior segmental arteries, groundglass infiltrate in the left lower lobe cystic pulmonary infarct, no evidence of right heart strain He saw APP on 1/12,Reviewed visit He had a visit with hematology yesterday and blood work was obtained to look for inherited risk factors including factor V Leiden and antiphospholipid antibodies  Accompanied by his wife today.  He reports persistent cough.  He took prednisone for 2 weeks which was prescribed by his brother and this seemed to help the cough the most when this was tapered off cough has returned. He also reports increasing fatigue especially when he does his workouts His resting heart rate is in the 80s which previously was in the 60s.  When he takes a deep breath he feels it in his left chest. He also reports sensation of feeling cold all the time.  Compliant with Eliquis. For his cough, Tessalon Perles and OTC Mucinex and Delsym have not provided relief  On ambulation, he did not desaturate, walked at a fast pace  Significant tests/ events reviewed  PET 05/2020 >> Increasing size and number of pulmonary nodules particularly in the RIGHT chest but with bronchovascular distribution . Increasing metabolic activity particularly in the RIGHT upper lobe   US thyroid >> 1.7 cm TR4 thyroid nodule in the left superior thyroid gland, 50 % Lt CCA plaque   03/2022 CT coronaries 55 percentile calcification, unchanged nodules CT chest from 04/23/2019 which showed slight increase in nodules in the  right upper lobe, left lower lobe nodules were stable.      CT chest 04/2018 which shows new left lower lobe nodule 2.5 x 2.1 cm slight increase in right upper lobe nodule otherwise stable changes   CT chest 08/2018 2 cm nodule left base + 1 cm satellite nodule , RUL stable, other nodules stable   02/2013 PE, right upper lobe 6 mm nodule  Spirometry  09/2018 lung function back to normal with ratio 77, FEV1 of 82% and FVC of 81%   Spirometry 10/2017  drop in lung function with FEV1 of 59% FVC of 59% a ratio of 76 suggesting moderate restriction   PFTs 06/2017 show normal ratio, FEV1 of 83%, FVC of 86% and DLCO of 80% Calcium 9.9 ACE 19  Review of Systems neg for any significant sore throat, dysphagia, itching, sneezing, nasal congestion or excess/ purulent secretions, fever, chills, sweats, unintended wt loss, pleuritic or exertional cp, hempoptysis, orthopnea pnd or change in chronic leg swelling. Also denies presyncope, palpitations, heartburn, abdominal pain, nausea, vomiting, diarrhea or change in bowel or urinary habits, dysuria,hematuria, rash, arthralgias, visual complaints, headache, numbness weakness or ataxia.     Objective:   Physical Exam   Gen. Pleasant, well-nourished, in no distress ENT - no thrush, no pallor/icterus,no post nasal drip Neck: No JVD, no thyromegaly, no carotid bruits Lungs: no use of accessory muscles, no dullness to percussion, clear without rales or rhonchi  Cardiovascular: Rhythm regular, heart sounds  normal, no murmurs or gallops, no peripheral edema Musculoskeletal: No deformities, no cyanosis or clubbing         Assessment & Plan:

## 2023-01-19 NOTE — Assessment & Plan Note (Signed)
Waxing and waning nodules especially in the perivascular and perifissural distribution likely suggestive of sarcoidosis, previous biopsy has shown granulomas. His main symptoms are persistent cough -this could be postviral, less likely from pulmonary infarct but could be related to sarcoidosis. Have asked him to take an extended course of prednisone 10 mg for 2 weeks and then taper once cough resolves. He does not want to be on long-term prednisone

## 2023-01-20 LAB — HEXAGONAL PHASE PHOSPHOLIPID: Hexagonal Phase Phospholipid: 4 s (ref 0–11)

## 2023-01-20 LAB — BETA-2-GLYCOPROTEIN I ABS, IGG/M/A
Beta-2 Glyco I IgG: <9 GPI IgG units (ref 0–20)
Beta-2-Glycoprotein I IgA: <9 GPI IgA units (ref 0–25)
Beta-2-Glycoprotein I IgM: <9 GPI IgM units (ref 0–32)

## 2023-01-20 LAB — LUPUS ANTICOAGULANT PANEL
DRVVT: 83.5 s — ABNORMAL HIGH (ref 0.0–47.0)
PTT Lupus Anticoagulant: 46.8 s — ABNORMAL HIGH (ref 0.0–43.5)

## 2023-01-20 LAB — PTT-LA MIX: PTT-LA Mix: 46.5 s — ABNORMAL HIGH (ref 0.0–40.5)

## 2023-01-20 LAB — ANTITHROMBIN III ANTIGEN: AT III AG PPP IMM-ACNC: 114 % (ref 72–124)

## 2023-01-20 LAB — DRVVT CONFIRM: dRVVT Confirm: 1.2 ratio (ref 0.8–1.2)

## 2023-01-20 LAB — PROTEIN S, TOTAL: Protein S Ag, Total: 150 % (ref 60–150)

## 2023-01-20 LAB — FACTOR 8 ASSAY: Coagulation Factor VIII: 312 % — ABNORMAL HIGH (ref 56–140)

## 2023-01-20 LAB — DRVVT MIX: dRVVT Mix: 55.4 s — ABNORMAL HIGH (ref 0.0–40.4)

## 2023-01-22 LAB — CARDIOLIPIN ANTIBODIES, IGG, IGM, IGA
Anticardiolipin IgA: <9 APL U/mL (ref 0–11)
Anticardiolipin IgG: <9 GPL U/mL (ref 0–14)
Anticardiolipin IgM: 12 MPL U/mL (ref 0–12)

## 2023-01-22 LAB — PROTEIN C, TOTAL: Protein C, Total: 88 % (ref 60–150)

## 2023-01-23 ENCOUNTER — Telehealth (HOSPITAL_BASED_OUTPATIENT_CLINIC_OR_DEPARTMENT_OTHER): Payer: Self-pay | Admitting: Pulmonary Disease

## 2023-01-26 NOTE — Telephone Encounter (Signed)
Called [facility] with patient still having cough needs refill on prednisone not getting better

## 2023-01-26 NOTE — Telephone Encounter (Signed)
ATC LVMTCB x 1  

## 2023-01-27 LAB — FACTOR 5 LEIDEN

## 2023-01-27 LAB — PROTHROMBIN GENE MUTATION

## 2023-01-27 NOTE — Telephone Encounter (Signed)
Attempted to call pt but unable to reach. Left message to return call.   Due to multiple attempts trying to reach pt with not being able to do so, per protocol encounter will be closed.

## 2023-02-02 NOTE — Progress Notes (Signed)
HEMATOLOGY-ONCOLOGY TELEPHONE VISIT PROGRESS NOTE  I connected with our patient on 02/03/23 at  8:00 AM EST by telephone and verified that I am speaking with the correct person using two identifiers.  I discussed the limitations, risks, security and privacy concerns of performing an evaluation and management service by telephone and the availability of in person appointments.  I also discussed with the patient that there may be a patient responsible charge related to this service. The patient expressed understanding and agreed to proceed.   History of Present Illness: Jeffrey Adkins 67 y.o. male is here because of recent diagnosis of acute left lower lobe pulmonary embolism with infarction.  This happened on 12/07/2022 when he woke up with a sudden onset of stabbing chest pain and hemoptysis.  He went to the emergency room and a CT chest revealed the pulmonary embolism. He presents to the clinic for a telephone follow-up to discuss the results of the tests.  REVIEW OF SYSTEMS:   Constitutional: Denies fevers, chills or abnormal weight loss All other systems were reviewed with the patient and are negative.  Observations/Objective:     Assessment Plan:  Pulmonary embolism 12/15/2022: CT angiogram: Acute pulmonary embolism within the distal left lower lobe pulmonary artery extending into the lateral and posterior segmental arteries.  Possible pulmonary infarct 2013: Acute pulmonary embolism treated with Xarelto for 3 months  Hypercoagulability workup 02/03/2023: Factor VIII: 312% Antithrombin III: 114%: Normal, protein S: 150%: Normal, protein C: 88%: Normal Factor V Leiden: Not detected, prothrombin gene mutation: Not detected No lupus anticoagulant  Based on extensive workup for hypercoagulability the only abnormality we detected was an elevated factor VIII.  Rest of the blood work was normal.  Therefore I do not recommend lifelong anticoagulation. Recommend 6 months of  anticoagulation.  Return to clinic on an as-needed basis    I discussed the assessment and treatment plan with the patient. The patient was provided an opportunity to ask questions and all were answered. The patient agreed with the plan and demonstrated an understanding of the instructions. The patient was advised to call back or seek an in-person evaluation if the symptoms worsen or if the condition fails to improve as anticipated.   I provided 12 minutes of non-face-to-face time during this encounter.  This includes time for charting and coordination of care   Harriette Ohara, MD  I Gardiner Coins am acting as a scribe for Dr.Amante Fomby  I have reviewed the above documentation for accuracy and completeness, and I agree with the above.

## 2023-02-02 NOTE — Assessment & Plan Note (Signed)
12/15/2022: CT angiogram: Acute pulmonary embolism within the distal left lower lobe pulmonary artery extending into the lateral and posterior segmental arteries.  Possible pulmonary infarct 2013: Acute pulmonary embolism treated with Xarelto for 3 months  Hypercoagulability workup 02/03/2023: Factor VIII: 312% Antithrombin III: 114%: Normal, protein S: 150%: Normal, protein C: 88%: Normal Factor V Leiden: Not detected, prothrombin gene mutation: Not detected No lupus anticoagulant  Based on extensive workup for hypercoagulability the only abnormality we detected was an elevated factor VIII.  Rest of the blood work was normal.  Therefore I do not recommend lifelong anticoagulation. Recommend 6 months of anticoagulation.  Return to clinic on an as-needed basis

## 2023-02-03 ENCOUNTER — Inpatient Hospital Stay: Payer: BC Managed Care – PPO | Attending: Hematology and Oncology | Admitting: Hematology and Oncology

## 2023-02-03 DIAGNOSIS — I2694 Multiple subsegmental pulmonary emboli without acute cor pulmonale: Secondary | ICD-10-CM

## 2023-03-24 DIAGNOSIS — H9203 Otalgia, bilateral: Secondary | ICD-10-CM | POA: Diagnosis not present

## 2023-03-24 DIAGNOSIS — J029 Acute pharyngitis, unspecified: Secondary | ICD-10-CM | POA: Diagnosis not present

## 2023-03-24 DIAGNOSIS — Z03818 Encounter for observation for suspected exposure to other biological agents ruled out: Secondary | ICD-10-CM | POA: Diagnosis not present

## 2023-03-24 DIAGNOSIS — J209 Acute bronchitis, unspecified: Secondary | ICD-10-CM | POA: Diagnosis not present

## 2023-04-01 DIAGNOSIS — K8020 Calculus of gallbladder without cholecystitis without obstruction: Secondary | ICD-10-CM | POA: Diagnosis not present

## 2023-04-01 DIAGNOSIS — Z885 Allergy status to narcotic agent status: Secondary | ICD-10-CM | POA: Diagnosis not present

## 2023-04-01 DIAGNOSIS — D869 Sarcoidosis, unspecified: Secondary | ICD-10-CM | POA: Diagnosis not present

## 2023-04-01 DIAGNOSIS — K922 Gastrointestinal hemorrhage, unspecified: Secondary | ICD-10-CM | POA: Diagnosis not present

## 2023-04-01 DIAGNOSIS — Z7901 Long term (current) use of anticoagulants: Secondary | ICD-10-CM | POA: Diagnosis not present

## 2023-04-01 DIAGNOSIS — M25519 Pain in unspecified shoulder: Secondary | ICD-10-CM | POA: Diagnosis not present

## 2023-04-01 DIAGNOSIS — R5381 Other malaise: Secondary | ICD-10-CM | POA: Diagnosis not present

## 2023-04-01 DIAGNOSIS — K219 Gastro-esophageal reflux disease without esophagitis: Secondary | ICD-10-CM | POA: Diagnosis not present

## 2023-04-01 DIAGNOSIS — I493 Ventricular premature depolarization: Secondary | ICD-10-CM | POA: Diagnosis not present

## 2023-04-01 DIAGNOSIS — R531 Weakness: Secondary | ICD-10-CM | POA: Diagnosis not present

## 2023-04-01 DIAGNOSIS — R9431 Abnormal electrocardiogram [ECG] [EKG]: Secondary | ICD-10-CM | POA: Diagnosis not present

## 2023-04-01 DIAGNOSIS — R0602 Shortness of breath: Secondary | ICD-10-CM | POA: Diagnosis not present

## 2023-04-01 DIAGNOSIS — R42 Dizziness and giddiness: Secondary | ICD-10-CM | POA: Diagnosis not present

## 2023-04-01 DIAGNOSIS — M542 Cervicalgia: Secondary | ICD-10-CM | POA: Diagnosis not present

## 2023-04-01 DIAGNOSIS — R55 Syncope and collapse: Secondary | ICD-10-CM | POA: Diagnosis not present

## 2023-04-01 DIAGNOSIS — R0609 Other forms of dyspnea: Secondary | ICD-10-CM | POA: Diagnosis not present

## 2023-04-01 DIAGNOSIS — Z8614 Personal history of Methicillin resistant Staphylococcus aureus infection: Secondary | ICD-10-CM | POA: Diagnosis not present

## 2023-04-01 DIAGNOSIS — Z86711 Personal history of pulmonary embolism: Secondary | ICD-10-CM | POA: Diagnosis not present

## 2023-04-01 DIAGNOSIS — K625 Hemorrhage of anus and rectum: Secondary | ICD-10-CM | POA: Diagnosis not present

## 2023-04-01 DIAGNOSIS — R519 Headache, unspecified: Secondary | ICD-10-CM | POA: Diagnosis not present

## 2023-04-02 DIAGNOSIS — Z8614 Personal history of Methicillin resistant Staphylococcus aureus infection: Secondary | ICD-10-CM | POA: Diagnosis not present

## 2023-04-02 DIAGNOSIS — D869 Sarcoidosis, unspecified: Secondary | ICD-10-CM | POA: Diagnosis not present

## 2023-04-02 DIAGNOSIS — Z86711 Personal history of pulmonary embolism: Secondary | ICD-10-CM | POA: Diagnosis not present

## 2023-04-02 DIAGNOSIS — K219 Gastro-esophageal reflux disease without esophagitis: Secondary | ICD-10-CM | POA: Diagnosis not present

## 2023-04-02 DIAGNOSIS — R519 Headache, unspecified: Secondary | ICD-10-CM | POA: Diagnosis not present

## 2023-04-02 DIAGNOSIS — R5383 Other fatigue: Secondary | ICD-10-CM | POA: Diagnosis not present

## 2023-04-02 DIAGNOSIS — R531 Weakness: Secondary | ICD-10-CM | POA: Diagnosis not present

## 2023-04-02 DIAGNOSIS — Z7901 Long term (current) use of anticoagulants: Secondary | ICD-10-CM | POA: Diagnosis not present

## 2023-04-02 DIAGNOSIS — R918 Other nonspecific abnormal finding of lung field: Secondary | ICD-10-CM | POA: Diagnosis not present

## 2023-04-02 DIAGNOSIS — R55 Syncope and collapse: Secondary | ICD-10-CM | POA: Diagnosis not present

## 2023-04-02 DIAGNOSIS — K922 Gastrointestinal hemorrhage, unspecified: Secondary | ICD-10-CM | POA: Diagnosis not present

## 2023-04-02 DIAGNOSIS — Z885 Allergy status to narcotic agent status: Secondary | ICD-10-CM | POA: Diagnosis not present

## 2023-04-02 DIAGNOSIS — M25519 Pain in unspecified shoulder: Secondary | ICD-10-CM | POA: Diagnosis not present

## 2023-04-02 DIAGNOSIS — R591 Generalized enlarged lymph nodes: Secondary | ICD-10-CM | POA: Diagnosis not present

## 2023-04-02 DIAGNOSIS — R42 Dizziness and giddiness: Secondary | ICD-10-CM | POA: Diagnosis not present

## 2023-04-02 DIAGNOSIS — M542 Cervicalgia: Secondary | ICD-10-CM | POA: Diagnosis not present

## 2023-04-02 DIAGNOSIS — R5381 Other malaise: Secondary | ICD-10-CM | POA: Diagnosis not present

## 2023-04-02 DIAGNOSIS — K8020 Calculus of gallbladder without cholecystitis without obstruction: Secondary | ICD-10-CM | POA: Diagnosis not present

## 2023-04-02 DIAGNOSIS — D860 Sarcoidosis of lung: Secondary | ICD-10-CM | POA: Diagnosis not present

## 2023-04-02 DIAGNOSIS — R0602 Shortness of breath: Secondary | ICD-10-CM | POA: Diagnosis not present

## 2023-04-02 DIAGNOSIS — I959 Hypotension, unspecified: Secondary | ICD-10-CM | POA: Diagnosis not present

## 2023-04-02 DIAGNOSIS — K625 Hemorrhage of anus and rectum: Secondary | ICD-10-CM | POA: Diagnosis not present

## 2023-04-02 DIAGNOSIS — R0609 Other forms of dyspnea: Secondary | ICD-10-CM | POA: Diagnosis not present

## 2023-04-24 ENCOUNTER — Other Ambulatory Visit: Payer: Self-pay | Admitting: Pulmonary Disease

## 2023-04-24 ENCOUNTER — Encounter (HOSPITAL_BASED_OUTPATIENT_CLINIC_OR_DEPARTMENT_OTHER): Payer: Self-pay | Admitting: Pulmonary Disease

## 2023-04-24 ENCOUNTER — Ambulatory Visit (HOSPITAL_BASED_OUTPATIENT_CLINIC_OR_DEPARTMENT_OTHER): Payer: BC Managed Care – PPO | Admitting: Pulmonary Disease

## 2023-04-24 VITALS — BP 132/76 | HR 74 | Temp 97.6°F | Ht 71.0 in | Wt 190.0 lb

## 2023-04-24 DIAGNOSIS — I2694 Multiple subsegmental pulmonary emboli without acute cor pulmonale: Secondary | ICD-10-CM

## 2023-04-24 DIAGNOSIS — R911 Solitary pulmonary nodule: Secondary | ICD-10-CM | POA: Diagnosis not present

## 2023-04-24 MED ORDER — RIVAROXABAN 15 MG PO TABS: 15.0000 mg | ORAL_TABLET | Freq: Two times a day (BID) | ORAL | 0 refills | Status: DC

## 2023-04-24 MED ORDER — RIVAROXABAN 20 MG PO TABS
20.0000 mg | ORAL_TABLET | Freq: Every day | ORAL | 2 refills | Status: DC
Start: 2023-04-24 — End: 2024-08-29

## 2023-04-24 NOTE — Patient Instructions (Addendum)
X check CBC, D-dimer  We will restart Xarelto with loading dose CT chest in April 2025

## 2023-04-24 NOTE — Assessment & Plan Note (Signed)
Previously has been attributed to sarcoidosis with finding granulomas on biopsy.  This seemed to wax and wane over the years currently waning on his last CT scan. 1 year follow-up of these nodules should be sufficient in April 2025

## 2023-04-24 NOTE — Progress Notes (Signed)
Subjective:    Patient ID: Jeffrey Adkins, male    DOB: 09/15/1956, 67 y.o.   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    PMH - childhood asthma  - unprovoked pulmonary embolism in 2014 after a trip to [place] for which he took Xarelto for 4 months -Second episode of PE 11/2022     COURSE :   He was found to have a 6mm RUL nodule on CT scan when the PE was dx 02/2013 He had repeat CT scan 02/26/14, PET 02/28/14 that showed the nodule to be increased in size, slightly hypermetabolic on PET, also hypermetabolic mediastinal nodes borderline enlarged.      CT chest 06/2017 showed right upper lobe nodule increased in size to 13 mm and 9 mm left lower lobe nodule which was also increased compared to prior imaging, these were both hypermetabolic on PET. PET scan also showed hypermetabolic subcarinal and hilar lymph nodes as also an external iliac, may be also a lymph node along the caudate lobe of liver. At least one of the mediastinal lymph nodes appeared to be calcified in the interim   He underwent CT-guided needle biopsy of right upper lobe nodule on 07/24/17 and mediast[facility] on 07/25/17 at [facility] by Dr. Benito Mccreedy. Pathology showed necrotizing and non necrotizing granulomas , fungal stains and cultures negative . DNA probes were negative for mycobacteria, bacteria  and fungus.     Serial imaging showed appearance of new left lower lobe 2.5 cm nodule in 2019 which then decreased in size.  Lung function also decreased hence we treated him with low-dose prednisone for 2 to 3 months without symptomatic benefit     3 month FU visit He had an episode of recurrent pulm embolism in December 2023 Last OV -he had persistent cough extended course of prednisone 10 mg for 2 weeks and then taper once  cough resolves. No desat on ambulation   He had episodes of hypotension and went to [facility], he also had hemorrhoidal bleeding, bright red blood per rectum .  Hemoglobin was 14.1.  I reviewed discharge summary in Care Everywhere CT imaging of the chest showing nodular interstitial opacities in the right upper lobe, left upper lobe and superior and dependent left lower lobe. Other soft tissue nodules noted some of which have decreased in size, 1 which has increased spiculation, and additional tiny new nodular densities compared to previous PET scan.  No clots were noted, he stopped taking Eliquis by second week of April  He states that cough persisted until 2 weeks ago.  He still has some tiredness. 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 Increasing metabolic activity particularly in the RIGHT upper lobe   US thyroid >> 1.7 cm TR4 thyroid nodule in the left superior thyroid gland, 50 % Lt CCA plaque   03/2022 CT coronaries 55 percentile calcification, unchanged nodules CT chest from 04/23/2019 which showed slight increase in nodules in the right upper lobe, left lower lobe nodules were stable.       CT chest 04/2018 which shows new left lower lobe  nodule 2.5 x 2.1 cm slight increase in right upper lobe nodule otherwise stable changes   CT chest 08/2018 2 cm nodule left base + 1 cm satellite nodule , RUL stable, other nodules stable     02/2013 PE, right upper lobe 6 mm nodule   Spirometry  09/2018 lung function back to normal with ratio 77, FEV1 of 82% and FVC of 81%   Spirometry 10/2017  drop in lung function with FEV1 of 59% FVC of 59% a ratio of 76 suggesting moderate restriction   PFTs 06/2017 show normal ratio, FEV1 of  83%, FVC of 86% and DLCO of 80% Calcium 9.9 ACE 19   Review of Systems neg for any significant sore throat, dysphagia, itching, sneezing, nasal congestion or excess/ purulent secretions, fever, chills, sweats, unintended wt loss, pleuritic or exertional cp, hempoptysis, orthopnea pnd or change in chronic leg swelling. Also denies presyncope, palpitations, heartburn, abdominal pain, nausea, vomiting, diarrhea or change in bowel or urinary habits, dysuria,hematuria, rash, arthralgias, visual complaints, headache, numbness weakness or ataxia.     Objective:   Physical Exam  Gen. Pleasant, well-nourished, in no distress ENT - no thrush, no pallor/icterus,no post nasal drip Neck: No JVD, no thyromegaly, no carotid bruits Lungs: no use of accessory muscles, no dullness to percussion, clear without rales or rhonchi  Cardiovascular: Rhythm regular, heart sounds  normal, no murmurs or gallops, no peripheral edema Musculoskeletal: No deformities, no cyanosis or clubbing         Assessment & Plan:

## 2023-04-24 NOTE — Assessment & Plan Note (Signed)
I told Jeffrey Adkins that I deferred from the other opinions he has received so far.  This is his second episode of unprovoked PE and I explained to him that his risk of a third episode is certainly much higher than the average population. I would advise him lifelong anticoagulation-based on risk-benefit analysis. He was acceptable of this advice.  Since he has been off anticoagulation since second week of April.  I suggested this would be a good time to check D-dimer which would also help to rule stratify him regardless of what recommend anticoagulation rather than aspirin for him. He said that he would prefer to use Xarelto over Eliquis due to once daily dosing.  We will recheck CBC today

## 2023-04-25 LAB — CBC WITH DIFFERENTIAL/PLATELET
Basophils Absolute: 0.1 10*3/uL (ref 0.0–0.2)
Basos: 1 %
EOS (ABSOLUTE): 0.3 10*3/uL (ref 0.0–0.4)
Eos: 5 %
Hematocrit: 49.4 % (ref 37.5–51.0)
Hemoglobin: 16.6 g/dL (ref 13.0–17.7)
Immature Grans (Abs): 0 10*3/uL (ref 0.0–0.1)
Immature Granulocytes: 0 %
Lymphocytes Absolute: 1.3 10*3/uL (ref 0.7–3.1)
Lymphs: 22 %
MCH: 29.9 pg (ref 26.6–33.0)
MCHC: 33.6 g/dL (ref 31.5–35.7)
MCV: 89 fL (ref 79–97)
Monocytes Absolute: 0.6 10*3/uL (ref 0.1–0.9)
Monocytes: 11 %
Neutrophils Absolute: 3.5 10*3/uL (ref 1.4–7.0)
Neutrophils: 61 %
Platelets: 177 10*3/uL (ref 150–450)
RBC: 5.55 x10E6/uL (ref 4.14–5.80)
RDW: 14.4 % (ref 11.6–15.4)
WBC: 5.7 10*3/uL (ref 3.4–10.8)

## 2023-04-25 LAB — D-DIMER, QUANTITATIVE: D-DIMER: 0.25 mg/L FEU (ref 0.00–0.49)

## 2023-06-07 DIAGNOSIS — E041 Nontoxic single thyroid nodule: Secondary | ICD-10-CM | POA: Diagnosis not present

## 2023-06-07 DIAGNOSIS — Z125 Encounter for screening for malignant neoplasm of prostate: Secondary | ICD-10-CM | POA: Diagnosis not present

## 2023-06-07 DIAGNOSIS — Z0000 Encounter for general adult medical examination without abnormal findings: Secondary | ICD-10-CM | POA: Diagnosis not present

## 2023-06-07 DIAGNOSIS — E785 Hyperlipidemia, unspecified: Secondary | ICD-10-CM | POA: Diagnosis not present

## 2023-06-27 DIAGNOSIS — R748 Abnormal levels of other serum enzymes: Secondary | ICD-10-CM | POA: Diagnosis not present

## 2024-05-06 ENCOUNTER — Ambulatory Visit: Admitting: Nurse Practitioner

## 2024-05-06 ENCOUNTER — Encounter: Payer: Self-pay | Admitting: Nurse Practitioner

## 2024-05-06 VITALS — BP 142/90 | HR 72 | Ht 70.0 in | Wt 188.8 lb

## 2024-05-06 DIAGNOSIS — K5900 Constipation, unspecified: Secondary | ICD-10-CM | POA: Insufficient documentation

## 2024-05-06 DIAGNOSIS — I2694 Multiple subsegmental pulmonary emboli without acute cor pulmonale: Secondary | ICD-10-CM

## 2024-05-06 DIAGNOSIS — D869 Sarcoidosis, unspecified: Secondary | ICD-10-CM

## 2024-05-06 DIAGNOSIS — R911 Solitary pulmonary nodule: Secondary | ICD-10-CM | POA: Diagnosis not present

## 2024-05-06 DIAGNOSIS — R918 Other nonspecific abnormal finding of lung field: Secondary | ICD-10-CM | POA: Diagnosis not present

## 2024-05-06 DIAGNOSIS — K219 Gastro-esophageal reflux disease without esophagitis: Secondary | ICD-10-CM | POA: Insufficient documentation

## 2024-05-06 MED ORDER — OMEPRAZOLE 40 MG PO CPDR
40.0000 mg | DELAYED_RELEASE_CAPSULE | Freq: Every day | ORAL | 5 refills | Status: AC
Start: 2024-05-06 — End: ?

## 2024-05-06 NOTE — Assessment & Plan Note (Addendum)
 Symptoms are concerning for PUD. Will start him on PPI therapy for six weeks and refer to GI. GERD precautions reviewed. Aware of red flag symptoms.

## 2024-05-06 NOTE — Assessment & Plan Note (Signed)
 Advised to use OTC miralax and or colace. Increase fiber intake. Follow up with GI/PCP

## 2024-05-06 NOTE — Patient Instructions (Signed)
 Take omeprazole  40 mg daily, 30 minutes before breakfast in AM. Can take an extra dose in the evening if you need to  Continue famotidine  over the counter as needed for reflux   Referral to GI  CT chest ordered  Follow up in 1 year with Dr. Alva or KAtie Karn Derk,NP. If symptoms worsen, please contact office for sooner follow up or seek emergency care.

## 2024-05-06 NOTE — Assessment & Plan Note (Signed)
 Outside CT at [facility] 03/2023 with increased spiculation of one of his nodules. Plan was to repeat in 1 year; not completed. Ordered today. Will determine follow up based on results.

## 2024-05-06 NOTE — Assessment & Plan Note (Signed)
 He has had two occurrences of unprovoked PE. He had seen hematology who did not identify any specific cause/underlying hypercoagulable disease; recommended 6 months of therapy. Given his history and increased risk of third thromboembolism, recommendation by Dr. Villa Greaser was for lifelong anticoagulation. He has since discontinued anticoagulation. We reviewed this today and his risks of future event. He is aware of risk of recurrent PE and/or DVT and declines to restart therapy at this time. Reviewed red flag symptoms.   Patient Instructions  Take omeprazole  40 mg daily, 30 minutes before breakfast in AM. Can take an extra dose in the evening if you need to  Continue famotidine  over the counter as needed for reflux   Referral to GI  CT chest ordered  Follow up in 1 year with Dr. Alva or KAtie Iasiah Ozment,NP. If symptoms worsen, please contact office for sooner follow up or seek emergency care.

## 2024-05-06 NOTE — Progress Notes (Signed)
 @Patient  ID: Jeffrey Adkins, male    DOB: 10-27-1956, 68 y.o.   MRN: [id number]  Chief Complaint  Patient presents with   Follow-up    Patient states coughing has improvement     Referring provider: No ref. 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  He has a history of childhood asthma and unprovoked pulmonary embolism in 2014, which she was treated with Xarelto  for 4 months 4.  He is a patient Dr. Hortense Adkins and last seen in office 04/24/2023.  Past medical history significant for thyroid  nodule, GERD.  He was found to have a 6 mm right upper lobe nodule on CT scan when the PE was diagnosed 02/2013.  He had repeat CT scan 02/26/2014 and PET scan 02/28/2014 that showed the nodule to be increased in size, slightly hypermetabolic on PET and also hypermetabolic mediastinal nodes which were borderline enlarged.  Repeat imaging from July 2018 showed increased right upper lobe nodule, 13 mm, and left lower lobe 9 mm nodule which was also increased.  He was set up for CT-guided needle biopsy of right upper lobe nodule which was completed on July 24, 2017 and mediastinoscopy on 08/04/2017 at [facility] by Dr. Jeanelle Adkins.  Pathology showed necrotizing and nonnecrotizing granulomas, fungal stains and cultures negative.  DNA probes were negative for my bacteria, bacteria and fungus.  Serial imaging revealed new left lower lobe 2.5 cm nodule in 2019, which then decreased in size over time.  Lung function also decreased so he was treated with low-dose prednisone  for 2 to 3 months without symptomatic benefit.  He underwent repeat PET scan in June 2021 with increasing size and number of pulmonary nodules, particularly in the right chest but with bronchovascular distribution.  There is increasing metabolic activity particularly in the right upper lobe nodule.  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  He was symptomatically  stable so plan was to repeat imaging yearly.  He went to the emergency room on 11/25/2022.  He had had a cough for going on a month.  Was treated with multiple rounds of antibiotics and steroids.  No improvement and developed hemoptysis so he decided to seek further evaluation.  CTA chest revealed a acute PE within the distal left lower lobe extending into the lateral and posterior segmental arteries.  There is also a new groundglass opacity in the posterior left lower lobe, suspicious for pulmonary infarct.  Vital signs were stable and he did not have any oxygen requirement so he was discharged home on Eliquis  with plans to follow-up with pulmonary outpatient.  TEST/EVENTS:  12/15/2022 CTA chest: acute PE in distal left lower lobe extending into lateral and posterior segmental arteries. Ground glass opacity in LLL, suspicious for pulmonary infarct. No evidence of heart strain. Small left pleural effusion. Persistent peribronchovascular soft tissue thickening involving all lobes, decreased but persistent irregular right upper lobe nodules.  12/30/2022: Jeffrey Adkins with Jeffrey Fricke NP for hospital follow-up.  He was diagnosed with acute PE on 12/28, started on Eliquis .  He has completed starter pack and is on 5 mg twice daily.  No excessive bleeding or bruising.  No significant side effects.  This is his second pulmonary embolism and considered unprovoked.  No evidence of right heart strain on imaging.  Dopplers were not obtained.  He tells me today that he is slowly feeling better but is still feeling fatigued and having some episodes of hemoptysis.  They seem to be occurring  less frequent.  Feels like his cough has become more productive as the hemoptysis has reduced. White to green sputum. Still having some pleuritic pain. Denies fevers, chills, loss of appetite. He is eating and drinking well. Breathing seems to be improving. He had some trouble with orthopnea when he left the hospital but this has resolved.  He is usually  followed for suspected sarcoidosis. He was advised yearly CT scans but has not been seen since 2021. His CTA did show decreased nodularity, primarily in right lung. He does not want to continue with yearly screening until after he is 19, unless his symptoms change.   01/19/2023: OV with Dr. Villa Adkins. Had ED visit 12/28 with recurrent PE. Waiting on hematology referral. Likely will need lifelong anticoagulation. Persistent fatigue likely residual from PE. If persists, may proceed with CT and PFTs. Waxing and waning nodules especially in the perivascular and perifussural distribution likely suggestive of sarcoidosis. Previous biopsy with granulomas. Cough cough be postviral, less likely from pulmonary infarct; could be related to sarcoid. Extended prednisone .   04/24/2023: Ov with Dr. Villa Adkins. Espidoe of hypotension - went to [facility]. Hemorrhoidal bleeding, hgb was 14.1. CT imaging of the chest showed nodular interstitial opacities in RUL, LUL and LLL. Other soft tissue nodules, some decreased, 1 increased spiculation, and additional tiny new nodular densities compared to prior PET. Stopped Eliquis  2nd week of April. Cough resolved. 1 year f/u CT.  Differred from other opinions on anticoagulation. Second unprovoked PE; risk of third much higher. Advised lifelong anticoagulation based on this. Recheck d dimer. Restarted Xarelto .   05/06/2024: Today - follow up Patient presents today for follow up. He has not had follow up CT yet. He tells me overall he has been doing well since he was here last. Has an occasional cough, that is not bothersome. No issues with his breathing. No episodes of hemoptysis. He did discontinue the Xarelto  about a month after his last visit. He understands the risks of discontinuing anticoagulation and future thromboembolism.  His concern today is issues with reflux. Usually has chest/upper abdomen discomfort/burning that only occurs after eating. He does know certain foods that make it  worse. He uses an omeprazole  OTC as needed, which does help. He sometimes takes pepcid  as well. Ongoing for months. No palpitations, PND, orthopnea, dyspnea. He has been having issues with constipation over the last few months as well. No N/V, diarrhea, weight loss, anorexia, bloody stools, dysphagia, voice hoarseness. He would like a referral to GI.   Allergies  Allergen Reactions   Peanut-Containing Drug Products Anaphylaxis    All nuts   Mustard [Allyl Isothiocyanate] Other (See Comments)    other   Hydrocodone Itching    Pt clarified only itching, NOT hives    Immunization History  Administered Date(s) Administered   Moderna Sars-Covid-2 Vaccination 01/21/2020, 02/17/2020   PPD Test 02/21/2013    Past Medical History:  Diagnosis Date   Allergic rhinitis    Arthritis    Childhood asthma    Closed head injury with concussion    multiple times secondary to sports/rock climbing   Foot drop, right    Gallstones    GERD (gastroesophageal reflux disease)    Numbness and tingling    right leg    Pneumonia    last episode 4 to 5 years ago    Pulmonary embolism (HCC)    right lung   Rupture of right triceps tendon    Solitary pulmonary nodule 02/21/13   CT-PA performed  02/21/13. Enlarging on repeat CT scan 02/26/14. PET performed 02/28/14.    Staph infection    right knee    Tinnitus    Vertigo     Tobacco History: Social History   Tobacco Use  Smoking Status Never  Smokeless Tobacco Never   Counseling given: Not Answered   Outpatient Medications Prior to Visit  Medication Sig Dispense Refill   EPINEPHrine  0.3 mg/0.3 mL IJ SOAJ injection Inject into the muscle once.     omeprazole  (PRILOSEC) 20 MG capsule Take 2 capsules (40 mg total) by mouth daily. 60 capsule 0   benzonatate  (TESSALON ) 200 MG capsule Take 1 capsule (200 mg total) by mouth 3 (three) times daily as needed for cough. (Patient not taking: Reported on 05/06/2024) 30 capsule 1   famotidine  (PEPCID ) 10 MG  tablet Take 10 mg by mouth daily as needed.  (Patient not taking: Reported on 05/06/2024)     promethazine -dextromethorphan (PROMETHAZINE -DM) 6.25-15 MG/5ML syrup Take 5 mLs by mouth 4 (four) times daily as needed for cough. (Patient not taking: Reported on 05/06/2024) 240 mL 0   Rivaroxaban  (XARELTO ) 15 MG TABS tablet Take 1 tablet (15 mg total) by mouth 2 (two) times daily with a meal. (Patient not taking: Reported on 05/06/2024) 42 tablet 0   rivaroxaban  (XARELTO ) 20 MG TABS tablet Take 1 tablet (20 mg total) by mouth daily with supper. (Patient not taking: Reported on 05/06/2024) 30 tablet 2   No facility-administered medications prior to visit.     Review of Systems:   Constitutional: No weight loss or gain, night sweats, fevers, chills, fatigue or lassitude.  HEENT: No headaches, difficulty swallowing, tooth/dental problems, or sore throat. No sneezing, itching, ear ache, nasal congestion, or post nasal drip CV:  No chest pain, orthopnea, PND, swelling in lower extremities, anasarca, dizziness, palpitations, syncope Resp: +minimal occasional cough. No shortness of breath with exertion or at rest. No wheezing.  No chest wall deformity GI:  +heartburn, indigestion, constipation. No abdominal pain, nausea, vomiting, diarrhea, loss of appetite, bloody stools.  Skin: No rash, lesions, ulcerations MSK:  No joint pain or swelling. Neuro: No dizziness or lightheadedness.  Psych: No depression or anxiety. Mood stable.     Physical Exam:  BP (!) 142/90 (BP Location: Right Arm, Patient Position: Sitting, Cuff Size: Normal)   Pulse 72   Ht 5\' 10"  (1.778 m)   Wt 188 lb 12.8 oz (85.6 kg)   SpO2 96%   BMI 27.09 kg/m   GEN: Pleasant, interactive, well-appearing; in no acute distress. HEENT:  Normocephalic and atraumatic. PERRLA. Sclera white. Nasal turbinates pink, moist and patent bilaterally. No rhinorrhea present. Oropharynx pink and moist, without exudate or edema. No lesions, ulcerations, or  postnasal drip.  NECK:  Supple w/ fair ROM. No JVD present. Normal carotid impulses w/o bruits. Thyroid  symmetrical with no goiter or nodules palpated. No lymphadenopathy.   CV: RRR, no m/r/g, no peripheral edema. Pulses intact, +2 bilaterally. No cyanosis, pallor or clubbing. PULMONARY:  Unlabored, regular breathing. Clear bilaterally A&P w/o wheezes/rales/rhonchi. No accessory muscle use.  GI: BS present and normoactive. Soft, non-tender to palpation. No organomegaly or masses detected.  MSK: No erythema, warmth or tenderness. Cap refil <2 sec all extrem. No deformities or joint swelling noted.  Neuro: A/Ox3. No focal deficits noted.   Skin: Warm, no lesions or rashe Psych: Normal affect and behavior. Judgement and thought content appropriate.     Lab Results:  CBC    Component Value Date/Time  WBC 5.7 04/24/2023 0921   WBC 11.6 (H) 12/15/2022 0748   RBC 5.55 04/24/2023 0921   RBC 4.57 12/15/2022 0748   HGB 16.6 04/24/2023 0921   HCT 49.4 04/24/2023 0921   PLT 177 04/24/2023 0921   MCV 89 04/24/2023 0921   MCV 88 03/13/2014 1020   MCH 29.9 04/24/2023 0921   MCH 30.9 12/15/2022 0748   MCHC 33.6 04/24/2023 0921   MCHC 35.8 12/15/2022 0748   RDW 14.4 04/24/2023 0921   RDW 13.1 03/13/2014 1020   LYMPHSABS 1.3 04/24/2023 0921   LYMPHSABS 1.0 03/13/2014 1020   MONOABS 0.8 04/20/2021 0244   MONOABS 0.6 03/13/2014 1020   EOSABS 0.3 04/24/2023 0921   EOSABS 0.1 03/13/2014 1020   BASOSABS 0.1 04/24/2023 0921   BASOSABS 0.0 03/13/2014 1020    BMET    Component Value Date/Time   NA 138 12/15/2022 0748   NA 141 03/13/2014 1020   K 3.5 12/15/2022 0748   K 4.5 03/13/2014 1020   CL 103 12/15/2022 0748   CL 103 03/13/2014 1020   CO2 28 12/15/2022 0748   CO2 31 03/13/2014 1020   GLUCOSE 100 (H) 12/15/2022 0748   GLUCOSE 87 03/13/2014 1020   BUN 14 12/15/2022 0748   BUN 29 (H) 03/13/2014 1020   CREATININE 1.04 12/15/2022 0748   CREATININE 1.15 03/13/2014 1020   CALCIUM 9.3  12/15/2022 0748   CALCIUM 9.3 03/13/2014 1020   GFRNONAA >60 12/15/2022 0748   GFRNONAA >60 03/13/2014 1020   GFRAA >60 04/23/2019 1950   GFRAA >60 03/13/2014 1020    BNP No results found for: "BNP"   Imaging:  No results found.  Administration History     None           No data to display          No results found for: "NITRICOXIDE"      Assessment & Plan:   Pulmonary embolism He has had two occurrences of unprovoked PE. He had seen hematology who did not identify any specific cause/underlying hypercoagulable disease; recommended 6 months of therapy. Given his history and increased risk of third thromboembolism, recommendation by Dr. Villa Adkins was for lifelong anticoagulation. He has since discontinued anticoagulation. We reviewed this today and his risks of future event. He is aware of risk of recurrent PE and/or DVT and declines to restart therapy at this time. Reviewed red flag symptoms.   Patient Instructions  Take omeprazole  40 mg daily, 30 minutes before breakfast in AM. Can take an extra dose in the evening if you need to  Continue famotidine  over the counter as needed for reflux   Referral to GI  CT chest ordered  Follow up in 1 year with Dr. Alva or KAtie Nikoleta Dady,NP. If symptoms worsen, please contact office for sooner follow up or seek emergency care.    Pulmonary nodule Outside CT at [facility] 03/2023 with increased spiculation of one of his nodules. Plan was to repeat in 1 year; not completed. Ordered today. Will determine follow up based on results.   GERD (gastroesophageal reflux disease) Symptoms are concerning for PUD. Will start him on PPI therapy for six weeks and refer to GI. GERD precautions reviewed. Aware of red flag symptoms.   Constipation Advised to use OTC miralax and or colace. Increase fiber intake. Follow up with GI/PCP  I spent 35 minutes of dedicated to the care of this patient on the date of this encounter to include  pre-visit review of records,  face-to-face time with the patient discussing conditions above, post visit ordering of testing, clinical documentation with the electronic health record, making appropriate referrals as documented, and communicating necessary findings to members of the patients care team.  Roetta Clarke, NP 05/06/2024  Pt aware and understands NP's role.

## 2024-05-08 ENCOUNTER — Ambulatory Visit (HOSPITAL_BASED_OUTPATIENT_CLINIC_OR_DEPARTMENT_OTHER): Admitting: Pulmonary Disease

## 2024-05-31 ENCOUNTER — Ambulatory Visit: Admitting: Gastroenterology

## 2024-06-07 ENCOUNTER — Ambulatory Visit
Admission: RE | Admit: 2024-06-07 | Discharge: 2024-06-07 | Disposition: A | Discharge status: Home / Self Care | Source: Ambulatory Visit | Attending: Nurse Practitioner | Admitting: Nurse Practitioner

## 2024-06-07 DIAGNOSIS — R918 Other nonspecific abnormal finding of lung field: Secondary | ICD-10-CM

## 2024-06-07 DIAGNOSIS — D869 Sarcoidosis, unspecified: Secondary | ICD-10-CM

## 2024-06-11 DIAGNOSIS — E041 Nontoxic single thyroid nodule: Secondary | ICD-10-CM | POA: Diagnosis not present

## 2024-06-11 DIAGNOSIS — Z125 Encounter for screening for malignant neoplasm of prostate: Secondary | ICD-10-CM | POA: Diagnosis not present

## 2024-06-11 DIAGNOSIS — Z79899 Other long term (current) drug therapy: Secondary | ICD-10-CM | POA: Diagnosis not present

## 2024-06-11 DIAGNOSIS — E785 Hyperlipidemia, unspecified: Secondary | ICD-10-CM | POA: Diagnosis not present

## 2024-06-11 DIAGNOSIS — Z0000 Encounter for general adult medical examination without abnormal findings: Secondary | ICD-10-CM | POA: Diagnosis not present

## 2024-06-18 DIAGNOSIS — K080 Exfoliation of teeth due to systemic causes: Secondary | ICD-10-CM | POA: Diagnosis not present

## 2024-06-19 DIAGNOSIS — K080 Exfoliation of teeth due to systemic causes: Secondary | ICD-10-CM | POA: Diagnosis not present

## 2024-06-20 ENCOUNTER — Ambulatory Visit: Payer: Self-pay | Admitting: Nurse Practitioner

## 2024-06-20 NOTE — Progress Notes (Signed)
 Please notify patient that it appears his sarcoid has progressed on his CT scan of his chest. Could also be a chronic indolent infection. We can either have him undergo bronchoscopy for further evaluation, or since he is asymptomatic, he can continue with surveillance monitoring and repeat CT/PFT in 6 months. Thanks!

## 2024-07-03 ENCOUNTER — Ambulatory Visit: Payer: Self-pay | Admitting: Nurse Practitioner

## 2024-07-03 NOTE — Telephone Encounter (Signed)
 1st attempt to call patient back. No answer, LVM to call back. Routing for further attempts.    Copied from CRM [id number]. Topic: General - Other >> Jul 03, 2024  4:52 PM Shona S wrote: Reason for CRM: patient returning a call to Ami in regards to last notes. Patient states he is not experiencing any symptoms. Please call patient.

## 2024-07-03 NOTE — Progress Notes (Signed)
 Warren, can you please inquire as to what the other symptoms are? Respiratory symptoms, fevers, chills, hemoptysis, anorexia, weight loss, etc?  Dr. Jude, pt would like to move forward with bronchoscopy/BAL. Do you have openings in the next few weeks or would you like me to look at [facility] options? Thanks.

## 2024-07-03 NOTE — Telephone Encounter (Signed)
 FYI Only or Action Required?: FYI only for provider.  Called Nurse Triage reporting Report Symptoms.  Triage Disposition: Home Care  Patient/caregiver understands and will follow disposition?: Yes     Copied from CRM [id number]. Topic: General - Other >> Jul 03, 2024  4:52 PM Shona S wrote: Reason for CRM: patient returning a call to Ami in regards to last notes. Patient states he is not experiencing any symptoms. Please call patient.    Reason for Disposition  Nursing judgment or information in reference  Answer Assessment - Initial Assessment Questions 1. REASON FOR CALL: What is your main concern right now?     Patient returning a call to let the office know his only symptom is that he feels a little more fatigued than normal at the end of the day after being active. He denies any other symptoms at this time.  Protocols used: No Guideline Available-[facility]

## 2024-07-04 DIAGNOSIS — K080 Exfoliation of teeth due to systemic causes: Secondary | ICD-10-CM | POA: Diagnosis not present

## 2024-07-04 NOTE — Telephone Encounter (Signed)
Duplicate please see results note.

## 2024-07-08 NOTE — Progress Notes (Signed)
 I've sent Dr. Jude a staff message regarding bronch. Waiting on response. Thanks!

## 2024-07-09 NOTE — Progress Notes (Signed)
 Dr. Jude has reached out to pt to discuss possible bronchoscopy. Thanks.

## 2024-07-10 ENCOUNTER — Encounter: Payer: Self-pay | Admitting: Gastroenterology

## 2024-07-10 ENCOUNTER — Ambulatory Visit: Admitting: Gastroenterology

## 2024-07-10 ENCOUNTER — Telehealth: Payer: Self-pay

## 2024-07-10 VITALS — BP 144/72 | HR 59 | Ht 71.0 in | Wt 188.1 lb

## 2024-07-10 DIAGNOSIS — K219 Gastro-esophageal reflux disease without esophagitis: Secondary | ICD-10-CM

## 2024-07-10 DIAGNOSIS — R490 Dysphonia: Secondary | ICD-10-CM | POA: Diagnosis not present

## 2024-07-10 DIAGNOSIS — R911 Solitary pulmonary nodule: Secondary | ICD-10-CM

## 2024-07-10 DIAGNOSIS — K5900 Constipation, unspecified: Secondary | ICD-10-CM | POA: Diagnosis not present

## 2024-07-10 DIAGNOSIS — R12 Heartburn: Secondary | ICD-10-CM

## 2024-07-10 DIAGNOSIS — Z800 Family history of malignant neoplasm of digestive organs: Secondary | ICD-10-CM

## 2024-07-10 DIAGNOSIS — R1013 Epigastric pain: Secondary | ICD-10-CM

## 2024-07-10 DIAGNOSIS — D869 Sarcoidosis, unspecified: Secondary | ICD-10-CM

## 2024-07-10 NOTE — Progress Notes (Signed)
 Jeffrey Adkins [id number] [birth year]   Chief Complaint: GERD  Referring Provider: Malachy Comer GAILS, NP Primary GI MD: Sampson  HPI: Jeffrey Adkins is a 68 y.o. 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    Patient follows with pulmonology, last office visit with them 05/06/2024, at which time he expressed concerns regarding reflux including chest/upper abdomen discomfort and burning after eating.  Has been using omeprazole  OTC as needed with some improvement.  Symptoms ongoing for months.  He was started on PPI therapy and referred to GI for further evaluation.    CT chest 06/07/2024 showed interval progression of diffuse central airway thickening, findings consistent with sarcoidosis versus chronic indolent atypical infection.  Plan at this time is for him to move forward with bronchoscopy.  CT also showed cholelithiasis without evidence of gallbladder wall thickening or inflammation.  Thoracic esophagus unremarkable.  No acute abnormality in the upper abdomen.  Labs through PCP Gwen) 06/11/2024: Normal CBC with hemoglobin 14.9, hematocrit 42.5, MCV 88, platelets 173, normal CMP, normal PSA, normal TSH   Patient states he has had GERD for over 10 years.  Reports he had an EGD around 2014 (Dr. Ethyl?) which he says showed inflammation in his upper stomach and esophagus.  States he has used omeprazole  OTC sporadically in the past.  Omeprazole  always relieves his symptoms.  Has previously gone months without symptoms and would only use when symptoms flared up.    Now reports that the last several months he is having symptoms of acid reflux, epigastric burning and heartburn which are occurring more frequently and seem to be worse than he has experienced in the past.  He does still get relief with Prilosec.  Was taking  daily over the last month, though stopped taking about 3 days ago.  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  Patient denies nausea, vomiting, dysphagia, unintentional weight loss, rectal bleeding, melena.  Reports he has a good appetite.  He is very active and gets regular exercise.  He reports occasional constipation that is relieved with exercise and denies any diarrhea or rectal pain, or abdominal pain associated with bowel movements.  Denies blood in his stool.  He has never had colon cancer screening and declines any form of colon cancer screening.  His grandmother died of colon cancer at age 59.  Denies any first-degree relatives with colon cancer history.  He does have history of pulmonary embolism x 2 and is prescribed Xarelto , though states he only took for a few months following pulmonary embolisms and then has taken himself off of it, going against the medical advice of his pulmonologist.  First pulmonary embolism in 2014, second episode 11/2022.  Denies any chest pain or shortness of breath today.  Denies any history of MI/stroke.  Previous GI Procedures/Imaging      Past Medical History:  Diagnosis Date   Allergic rhinitis    Arthritis    Childhood asthma    Closed head injury with concussion    multiple times secondary to sports/rock climbing   Foot drop, right    Gallstones    GERD (gastroesophageal reflux disease)    Numbness and tingling    right leg    Pneumonia    last episode 4 to 5 years  ago    Pulmonary embolism (HCC)    right lung   Rupture of right triceps tendon    Solitary pulmonary nodule 02/21/13   CT-PA performed 02/21/13. Enlarging on repeat CT scan 02/26/14. PET performed 02/28/14.    Staph infection    right knee    Tinnitus    Vertigo     Past Surgical History:  Procedure Laterality Date   amputation great right toe      ANKLE SURGERY     BACK  SURGERY     EYE SURGERY     LASIK 1996    KNEE SURGERY     right knee scoped 1992; I&D 6 times;    left ring finger      secondary to tendon tear    PICC LINE PLACE PERIPHERAL ([facility] HX)     history of in 1992 secondary to staph infection    TOTAL KNEE ARTHROPLASTY Right 12/18/2015   Procedure: RIGHT TOTAL KNEE ARTHROPLASTY;  Surgeon: Donnice Car, MD;  Location: [facility];  Service: Orthopedics;  Laterality: Right;   TRICEPS TENDON REPAIR Right 04/08/2016   Procedure: RIGHT TRICEPS REPAIR BURSECTOMY ;  Surgeon: Elsie Mussel, MD;  Location: [facility] OR;  Service: Orthopedics;  Laterality: Right;   ULNAR NERVE TRANSPOSITION Right 04/08/2016   Procedure: ULNAR NERVE DECOMPRESSION;  Surgeon: Elsie Mussel, MD;  Location: [facility] OR;  Service: Orthopedics;  Laterality: Right;    Current Outpatient Medications  Medication Sig Dispense Refill   benzonatate  (TESSALON ) 200 MG capsule Take 1 capsule (200 mg total) by mouth 3 (three) times daily as needed for cough. (Patient not taking: Reported on 05/06/2024) 30 capsule 1   EPINEPHrine  0.3 mg/0.3 mL IJ SOAJ injection Inject into the muscle once.     famotidine  (PEPCID ) 10 MG tablet Take 10 mg by mouth daily as needed.  (Patient not taking: Reported on 05/06/2024)     omeprazole  (PRILOSEC) 40 MG capsule Take 1 capsule (40 mg total) by mouth daily. For six weeks then as needed 45 capsule 5   promethazine -dextromethorphan (PROMETHAZINE -DM) 6.25-15 MG/5ML syrup Take 5 mLs by mouth 4 (four) times daily as needed for cough. (Patient not taking: Reported on 05/06/2024) 240 mL 0   Rivaroxaban  (XARELTO ) 15 MG TABS tablet Take 1 tablet (15 mg total) by mouth 2 (two) times daily with a meal. (Patient not taking: Reported on 05/06/2024) 42 tablet 0   rivaroxaban  (XARELTO ) 20 MG TABS tablet Take 1 tablet (20 mg total) by mouth daily with supper. (Patient not taking: Reported on 05/06/2024) 30 tablet 2   No current facility-administered medications for this visit.    Allergies  as of 07/10/2024 - Review Complete 05/06/2024  Allergen Reaction Noted   Peanut-containing drug products Anaphylaxis 02/21/2013   T[place] isothiocyanate] Other (See Comments) 02/21/2013   Hydrocodone Itching 01/28/2013    Family History  Problem Relation Age of Onset   Hypertension Mother    Sjogren's syndrome Mother    Celiac disease Sister     Social History   Tobacco Use   Smoking status: Never   Smokeless tobacco: Never  Vaping Use   Vaping status: Never Used  Substance Use Topics   Alcohol use: No   Drug use: No     Review of Systems:    Constitutional: No unintentional weight loss, fever, chills, weakness Eyes: No change in vision Ears, Nose, Throat:  No change in hearing or congestion Skin: No rash or itching Cardiovascular: No chest pain, chest pressure or palpitations  Respiratory: No SOB or cough Gastrointestinal: See HPI and otherwise negative Neurological: No headache, dizziness or syncope Musculoskeletal: No new muscle or joint pain Hematologic: No bleeding or bruising    Physical Exam:  Vital signs: BP (!) 144/72 (BP Location: Left Arm, Patient Position: Sitting, Cuff Size: Normal)   Pulse (!) 59   Ht 5' 11 (1.803 m)   Wt 188 lb 2 oz (85.3 kg)   BMI 26.24 kg/m    Wt Readings from Last 3 Encounters:  07/10/24 188 lb 2 oz (85.3 kg)  05/06/24 188 lb 12.8 oz (85.6 kg)  04/24/23 190 lb (86.2 kg)     Constitutional: Pleasant, well-appearing male in NAD, alert and cooperative Head:  Normocephalic and atraumatic.  Eyes: No scleral icterus. Conjunctiva pink. Mouth: No oral lesions. Respiratory: Respirations even and unlabored. Lungs clear to auscultation bilaterally.  No wheezes, crackles, or rhonchi.  Cardiovascular:  Regular rate and rhythm. No murmurs. No peripheral edema. Gastrointestinal:  Soft, nondistended, nontender. No rebound or guarding. Normal bowel sounds. No appreciable masses or hepatomegaly. Rectal:  Not performed.   Neurologic:  Alert and oriented x4;  grossly normal neurologically.  Skin:   Dry and intact without significant lesions or rashes. Psychiatric: Oriented to person, place and time. Demonstrates good judgement and reason without abnormal affect or behaviors.   RELEVANT LABS AND IMAGING: CBC    Component Value Date/Time   WBC 5.7 04/24/2023 0921   WBC 11.6 (H) 12/15/2022 0748   RBC 5.55 04/24/2023 0921   RBC 4.57 12/15/2022 0748   HGB 16.6 04/24/2023 0921   HCT 49.4 04/24/2023 0921   PLT 177 04/24/2023 0921   MCV 89 04/24/2023 0921   MCV 88 03/13/2014 1020   MCH 29.9 04/24/2023 0921   MCH 30.9 12/15/2022 0748   MCHC 33.6 04/24/2023 0921   MCHC 35.8 12/15/2022 0748   RDW 14.4 04/24/2023 0921   RDW 13.1 03/13/2014 1020   LYMPHSABS 1.3 04/24/2023 0921   LYMPHSABS 1.0 03/13/2014 1020   MONOABS 0.8 04/20/2021 0244   MONOABS 0.6 03/13/2014 1020   EOSABS 0.3 04/24/2023 0921   EOSABS 0.1 03/13/2014 1020   BASOSABS 0.1 04/24/2023 0921   BASOSABS 0.0 03/13/2014 1020    CMP     Component Value Date/Time   NA 138 12/15/2022 0748   NA 141 03/13/2014 1020   K 3.5 12/15/2022 0748   K 4.5 03/13/2014 1020   CL 103 12/15/2022 0748   CL 103 03/13/2014 1020   CO2 28 12/15/2022 0748   CO2 31 03/13/2014 1020   GLUCOSE 100 (H) 12/15/2022 0748   GLUCOSE 87 03/13/2014 1020   BUN 14 12/15/2022 0748   BUN 29 (H) 03/13/2014 1020   CREATININE 1.04 12/15/2022 0748   CREATININE 1.15 03/13/2014 1020   CALCIUM 9.3 12/15/2022 0748   CALCIUM 9.3 03/13/2014 1020   PROT 6.8 04/20/2021 0244   PROT 7.1 03/13/2014 1020   ALBUMIN 4.1 04/20/2021 0244   ALBUMIN 4.0 03/13/2014 1020   AST 270 (H) 04/20/2021 0244   AST 22 03/13/2014 1020   ALT 228 (H) 04/20/2021 0244   ALT 41 03/13/2014 1020   ALKPHOS 95 04/20/2021 0244   ALKPHOS 100 03/13/2014 1020   BILITOT 1.6 (H) 04/20/2021 0244   BILITOT 0.8 03/13/2014 1020   GFRNONAA >60 12/15/2022 0748   GFRNONAA >60 03/13/2014 1020   GFRAA >60 04/23/2019  1950   GFRAA >60 03/13/2014 1020     Assessment/Plan:   GERD Heartburn Burning epigastric pain Vocal  hoarseness Patient with 10+ years of intermittent GERD which has previously been relieved by OTC omeprazole .  In the last several months noticing worsening of symptoms and increased frequency of symptoms, though still has relief with omeprazole .  Recently was taking prescription omeprazole  40 mg daily, states he did well on that.    Reports he had an EGD around 2014 and was found to have inflammation in his stomach and esophagus.  We will request records from that procedure.  - Plan for EGD to evaluate worsening GERD symptoms. I thoroughly discussed the procedure with the patient to include nature of the procedure, alternatives, benefits, and risks (including but not limited to bleeding, infection, perforation, anesthesia/cardiac/pulmonary complications). Patient verbalized understanding and gave verbal consent to proceed with procedure.  - Request pulmonary clearance for procedure. - Will request Xarelto  hold (though patient states he is not currently taking). - Request past EGD records. - Start omeprazole  40 mg daily. - Further management pending EGD findings  Screening for colon cancer Patient has never had any form of colon cancer screening, having continuously declined this.  He does have family history of colon cancer in his grandmother who was diagnosed at age 52.  Counseled on colon cancer screening today, patient again declines screening.  He has occasional constipation which is relieved by exercise.  No diarrhea, no rectal pain or abdominal pain associated with bowel movements, no blood in his stool or melena.   Camie Furbish, PA-C [facility] 07/10/2024, 7:56 AM  Patient Care Team: Delice Charleston, MD (Inactive) as PCP - General (Internal Medicine)

## 2024-07-10 NOTE — Patient Instructions (Signed)
 You have been scheduled for an endoscopy. Please follow written instructions given to you at your visit today.  If you use inhalers (even only as needed), please bring them with you on the day of your procedure.  If you take any of the following medications, they will need to be adjusted prior to your procedure:   DO NOT TAKE 7 DAYS PRIOR TO TEST- Trulicity (dulaglutide) Ozempic, Wegovy (semaglutide) Mounjaro (tirzepatide) Bydureon Bcise (exanatide extended release)  DO NOT TAKE 1 DAY PRIOR TO YOUR TEST Rybelsus (semaglutide) Adlyxin (lixisenatide) Victoza (liraglutide) Byetta (exanatide) ___________________________________________________________________________   We will request clearance from Pulmonary. If you have not heard from our office within 1 week prior to your procedure, please contact us  at [phone].   Continue Omeprazole  40 mg once daily.    Follow-up in 3 months. Office will contact you to schedule.   _______________________________________________________  If your blood pressure at your visit was 140/90 or greater, please contact your primary care physician to follow up on this.  _______________________________________________________  If you are age 66 or older, your body mass index should be between 23-30. Your Body mass index is 26.24 kg/m. If this is out of the aforementioned range listed, please consider follow up with your Primary Care Provider.  If you are age 67 or younger, your body mass index should be between 19-25. Your Body mass index is 26.24 kg/m. If this is out of the aformentioned range listed, please consider follow up with your Primary Care Provider.   ________________________________________________________  The [facility] providers would like to encourage you to use MYCHART to communicate with providers for non-urgent requests or questions.  Due to long hold times on the telephone, sending your provider a message by [facility] may be a  faster and more efficient way to get a response.  Please allow 48 business hours for a response.  Please remember that this is for non-urgent requests.  _______________________________________________________  [facility] is using a team-based approach to care.  Your team is made up of your doctor and two to three APPS. Our APPS (Nurse Practitioners and Physician Assistants) work with your physician to ensure care continuity for you. They are fully qualified to address your health concerns and develop a treatment plan. They communicate directly with your gastroenterologist to care for you. Seeing the Advanced Practice Practitioners on your physician's team can help you by facilitating care more promptly, often allowing for earlier appointments, access to diagnostic testing, procedures, and other specialty referrals.   Thank you for choosing me and [facility].

## 2024-07-10 NOTE — Telephone Encounter (Signed)
 Copied from CRM [id number]. Topic: Clinical - Lab/Test Results >> Jul 10, 2024 11:09 AM Rozanna MATSU wrote: Reason for CRM: PT CALLING BACK ABOUT HIS CT SCAN RESULTS STATED HE MISSED DR ALVA'S CALL ON YESTERDAY PLEASE CALL PT BACK [phone]   Routing to Dr Jude to discuss Bronch

## 2024-07-11 NOTE — Telephone Encounter (Signed)
 Discussed with patient - he is really no worse symptomatically. 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

## 2024-07-12 ENCOUNTER — Telehealth: Payer: Self-pay

## 2024-07-12 NOTE — Telephone Encounter (Signed)
-----   Message from [place] V. Alva sent at 07/12/2024 10:44 AM EDT ----- He is not taking xarelto  So ok to proceed ----- Message ----- From: Suellen Peers, CMA Sent: 07/11/2024   4:50 PM EDT To: Harden Jude GAILS, MD

## 2024-07-12 NOTE — Progress Notes (Signed)
 ____________________________________________________________  Attending physician addendum:  Thank you for sending this case to me. I have reviewed the entire note and agree with the plan.   Amada Jupiter, MD  ____________________________________________________________

## 2024-07-15 ENCOUNTER — Telehealth: Payer: Self-pay | Admitting: Nurse Practitioner

## 2024-07-15 NOTE — Telephone Encounter (Signed)
 Fax received from Dr. Legrand with [facility] to perform an endoscopic procedure on patient.  Patient may need to come off xarelto  x 2 days prior to the procedure which is set for 08/01/24.   Routing to [employer] who seen this pt last to see about xarelto  or if we need to get him worked in for appt here prior to procedure.

## 2024-07-15 NOTE — Telephone Encounter (Signed)
 He is off Xarelto  from what he told me at last OV.  He spoke with Dr. Jude recently. CT shows progression of lung disease but symptoms are stable.   Low risk given type of procedure. Factors that increase the risk for postoperative pulmonary complications are ILD/sarcoid, age, hx of PE  Respiratory complications generally occur in 1% of ASA Class I patients, 5% of ASA Class II and 10% of ASA Class III-IV patients These complications rarely result in mortality and include postoperative pneumonia, atelectasis, pulmonary embolism, ARDS and increased time requiring postoperative mechanical ventilation.   Overall, I recommend proceeding with the procedure if the risk for respiratory complications are outweighed by the potential benefits. This will need to be discussed between the patient and surgeon.   To reduce risks of respiratory complications, I recommend: --Pre- and post-operative incentive spirometry performed frequently while awake --Short duration of surgery as much as possible and avoid paralytic if possible --OOB, encourage mobility post-op  Thanks.

## 2024-07-16 NOTE — Telephone Encounter (Signed)
 Pt needs to be put on recall I assume since no schedule out

## 2024-07-16 NOTE — Telephone Encounter (Signed)
 Copy of this encounter was faxed to [facility]

## 2024-07-16 NOTE — Telephone Encounter (Signed)
 Patient has been placed on January recall list. Nothing further needed.

## 2024-07-24 ENCOUNTER — Encounter: Payer: Self-pay | Admitting: Gastroenterology

## 2024-07-29 DIAGNOSIS — S46312A Strain of muscle, fascia and tendon of triceps, left arm, initial encounter: Secondary | ICD-10-CM | POA: Diagnosis not present

## 2024-07-29 NOTE — Telephone Encounter (Signed)
 Copied from CRM [id number]. Topic: General - Other >> Jul 29, 2024 11:23 AM Antonio DEL wrote: Reason for CRM: Patient called, says he has a surgery scheduled on Thursday and needs clearance from Dr. Jude. Says the office is going to be sending over the form and he just wanted to give a heads up.  Surgical clearance fax has been received and sent to [employer]. Sending office marked as urgent, so upping status of this message.

## 2024-07-30 ENCOUNTER — Telehealth: Payer: Self-pay | Admitting: Nurse Practitioner

## 2024-07-30 ENCOUNTER — Telehealth: Payer: Self-pay | Admitting: Gastroenterology

## 2024-07-30 NOTE — Telephone Encounter (Signed)
 Fax received from Dr. Evalene Chancy with [facility] to perform a left distal triceps tendon rupture with ulnar nerve/eason repair on patient.  Patient needs surgery clearance. Surgery is 08/01/24. Patient was seen on 05/06/24. Office protocol is a risk assessment can be sent to surgeon if patient has been seen in 60 days or less.   Sending to [facility] for risk assessment or recommendations if patient needs to be seen in office prior to surgical procedure.

## 2024-07-30 NOTE — Telephone Encounter (Signed)
 Yes, that's fine, thank you  - HD

## 2024-07-30 NOTE — Telephone Encounter (Signed)
 Good Morning Dr. Legrand, I received a call from this patient requesting that we rescheduled his EGD that is for August the 14 th due to him having another surgery that day to repair his tricep from in injury he had. Patient is was rescheduled for September the 11 th at 1 PM. Please advise.

## 2024-07-31 NOTE — Telephone Encounter (Signed)
 Patient is calling and is wanting an update on his surgical clearance since his surgery is tomorrow.

## 2024-07-31 NOTE — Telephone Encounter (Signed)
 Moderate risk. Factors that increase the risk for postoperative pulmonary complications are sarcoidosis, hx of multiple PEs, age.   Respiratory complications generally occur in 1% of ASA Class I patients, 5% of ASA Class II and 10% of ASA Class III-IV patients These complications rarely result in mortality and include postoperative pneumonia, atelectasis, pulmonary embolism, ARDS and increased time requiring postoperative mechanical ventilation.   Overall, I recommend proceeding with the surgery if the risk for respiratory complications are outweighed by the potential benefits. This will need to be discussed between the patient and surgeon.   To reduce risks of respiratory complications, I recommend: --Pre- and post-operative incentive spirometry performed frequently while awake --Short duration of surgery as much as possible and avoid paralytic if possible --OOB, encourage mobility post-op --DVT prophylaxis, if indicated

## 2024-07-31 NOTE — Telephone Encounter (Signed)
 I have faxed copy of this encounter via [place] per [place] over to [employer]. I spoke with the pt and notified him that this was done.

## 2024-08-01 ENCOUNTER — Encounter: Admitting: Gastroenterology

## 2024-08-01 DIAGNOSIS — Y999 Unspecified external cause status: Secondary | ICD-10-CM | POA: Diagnosis not present

## 2024-08-01 DIAGNOSIS — M7022 Olecranon bursitis, left elbow: Secondary | ICD-10-CM | POA: Diagnosis not present

## 2024-08-01 DIAGNOSIS — G8918 Other acute postprocedural pain: Secondary | ICD-10-CM | POA: Diagnosis not present

## 2024-08-01 DIAGNOSIS — G5622 Lesion of ulnar nerve, left upper limb: Secondary | ICD-10-CM | POA: Diagnosis not present

## 2024-08-01 DIAGNOSIS — X58XXXA Exposure to other specified factors, initial encounter: Secondary | ICD-10-CM | POA: Diagnosis not present

## 2024-08-01 DIAGNOSIS — S46392A Other injury of muscle, fascia and tendon of triceps, left arm, initial encounter: Secondary | ICD-10-CM | POA: Diagnosis not present

## 2024-08-01 DIAGNOSIS — S46201A Unspecified injury of muscle, fascia and tendon of other parts of biceps, right arm, initial encounter: Secondary | ICD-10-CM | POA: Diagnosis not present

## 2024-08-01 HISTORY — PX: TRICEPS TENDON REPAIR: SHX2577

## 2024-08-12 DIAGNOSIS — G5622 Lesion of ulnar nerve, left upper limb: Secondary | ICD-10-CM | POA: Diagnosis not present

## 2024-08-29 ENCOUNTER — Encounter: Payer: Self-pay | Admitting: Gastroenterology

## 2024-08-29 ENCOUNTER — Ambulatory Visit: Admitting: Gastroenterology

## 2024-08-29 ENCOUNTER — Encounter: Admitting: Gastroenterology

## 2024-08-29 VITALS — BP 128/71 | HR 64 | Temp 96.8°F | Resp 12 | Ht 71.0 in | Wt 188.0 lb

## 2024-08-29 DIAGNOSIS — K219 Gastro-esophageal reflux disease without esophagitis: Secondary | ICD-10-CM

## 2024-08-29 MED ORDER — SODIUM CHLORIDE 0.9 % IV SOLN: 500.0000 mL | Freq: Once | INTRAVENOUS | Status: DC

## 2024-08-29 NOTE — Progress Notes (Signed)
 Sedate, gd SR, tolerated procedure well, VSS, report to RN

## 2024-08-29 NOTE — Patient Instructions (Signed)
 Resume previous diet. Continue present medications. PPI can be resumed or courses of therapy when/if frequent heartburn recurs. Return to office if needed.   YOU HAD AN ENDOSCOPIC PROCEDURE TODAY AT THE [facility]:   Refer to the procedure report that was given to you for any specific questions about what was found during the examination.  If the procedure report does not answer your questions, please call your gastroenterologist to clarify.  If you requested that your care partner not be given the details of your procedure findings, then the procedure report has been included in a sealed envelope for you to review at your convenience later.  YOU SHOULD EXPECT: Some feelings of bloating in the abdomen. Passage of more gas than usual.  Walking can help get rid of the air that was put into your GI tract during the procedure and reduce the bloating. If you had a lower endoscopy (such as a colonoscopy or flexible sigmoidoscopy) you may notice spotting of blood in your stool or on the toilet paper. If you underwent a bowel prep for your procedure, you may not have a normal bowel movement for a few days.  Please Note:  You might notice some irritation and congestion in your nose or some drainage.  This is from the oxygen used during your procedure.  There is no need for concern and it should clear up in a day or so.  SYMPTOMS TO REPORT IMMEDIATELY:  Following upper endoscopy (EGD)  Vomiting of blood or coffee ground material  New chest pain or pain under the shoulder blades  Painful or persistently difficult swallowing  New shortness of breath  Fever of 100F or higher  Black, tarry-looking stools  For urgent or emergent issues, a gastroenterologist can be reached at any hour by calling [phone]. Do not use MyChart messaging for urgent concerns.    DIET:  We do recommend a small meal at first, but then you may proceed to your regular diet.  Drink plenty of fluids but you should  avoid alcoholic beverages for 24 hours.  ACTIVITY:  You should plan to take it easy for the rest of today and you should NOT DRIVE or use heavy machinery until tomorrow (because of the sedation medicines used during the test).    FOLLOW UP: Our staff will call the number listed on your records the next business day following your procedure.  We will call around 7:15- 8:00 am to check on you and address any questions or concerns that you may have regarding the information given to you following your procedure. If we do not reach you, we will leave a message.     If any biopsies were taken you will be contacted by phone or by letter within the next 1-3 weeks.  Please call us  at [phone] if you have not heard about the biopsies in 3 weeks.    SIGNATURES/CONFIDENTIALITY: You and/or your care partner have signed paperwork which will be entered into your electronic medical record.  These signatures attest to the fact that that the information above on your After Visit Summary has been reviewed and is understood.  Full responsibility of the confidentiality of this discharge information lies with you and/or your care-partner.

## 2024-08-29 NOTE — Progress Notes (Signed)
 History and Physical:  This patient presents for endoscopic testing for: Encounter Diagnosis  Name Primary?   Gastroesophageal reflux disease, unspecified whether esophagitis present Yes    68 year old man here today for endoscopic evaluation of persistent reflux symptoms. Clinical details are outlined in the office consult note by our APP dated 07/10/2024.  There have been no significant clinical changes since that day.  Patient is otherwise without complaints or active issues today.   Past Medical History: Past Medical History:  Diagnosis Date   Allergic rhinitis    Allergy    mutiple food and evironmental allergies   Arthritis    Asthma    Childhood asthma    Closed head injury with concussion    multiple times secondary to sports/rock climbing   Foot drop, right    Gallstones    GERD (gastroesophageal reflux disease)    Numbness and tingling    right leg    Pneumonia    last episode 4 to 5 years ago    Pulmonary embolism (HCC)    right lung   Rupture of right triceps tendon    Sarcoidosis    Solitary pulmonary nodule 02/21/2013   CT-PA performed 02/21/13. Enlarging on repeat CT scan 02/26/14. PET performed 02/28/14.    Staph infection    right knee    Tinnitus    Vertigo      Past Surgical History: Past Surgical History:  Procedure Laterality Date   amputation great right toe      ANKLE SURGERY     BACK SURGERY     x3   BRONCHOSCOPY  2016   EYE SURGERY     LASIK 1996    KNEE SURGERY     right knee scoped 1992; I&D 6 times;    left ring finger      secondary to tendon tear    PICC LINE PLACE PERIPHERAL ([facility] HX)     history of in 1992 secondary to staph infection    TOTAL KNEE ARTHROPLASTY Right 12/18/2015   Procedure: RIGHT TOTAL KNEE ARTHROPLASTY;  Surgeon: Donnice Car, MD;  Location: [facility];  Service: Orthopedics;  Laterality: Right;   TRICEPS TENDON REPAIR Right 04/08/2016   Procedure: RIGHT TRICEPS REPAIR BURSECTOMY ;  Surgeon: Elsie Mussel, MD;   Location: [facility] OR;  Service: Orthopedics;  Laterality: Right;   TRICEPS TENDON REPAIR Left 08/01/2024   ULNAR NERVE TRANSPOSITION Right 04/08/2016   Procedure: ULNAR NERVE DECOMPRESSION;  Surgeon: Elsie Mussel, MD;  Location: [facility] OR;  Service: Orthopedics;  Laterality: Right;    Allergies: Allergies  Allergen Reactions   Other Anaphylaxis    Anaphylaxis-All nuts    The following will given the patient hives:  Cats  Dogs  Dust   Trees  Pollen   Hay  Allyl Isothiocyanate ( mustard ) -itching and hives   Peanut-Containing Drug Products Anaphylaxis    All nuts   Mustard [Allyl Isothiocyanate] Hives    other   Hydrocodone Itching    Pt clarified only itching, NOT hives    Outpatient Meds: Current Outpatient Medications  Medication Sig Dispense Refill   Acetaminophen  Extra Strength 500 MG CAPS 500 mg.     albuterol  (VENTOLIN  HFA) 108 (90 Base) MCG/ACT inhaler Inhale 2 puffs into the lungs every 4 (four) hours as needed.     aspirin  81 MG chewable tablet Chew 81 mg by mouth 2 (two) times daily.     loratadine (CLARITIN) 5 MG/5ML syrup Take 5 mg by mouth daily.  meloxicam (MOBIC) 15 MG tablet Take 15 mg by mouth daily.     benzonatate  (TESSALON ) 200 MG capsule Take 1 capsule (200 mg total) by mouth 3 (three) times daily as needed for cough. 30 capsule 1   EPINEPHrine  0.3 mg/0.3 mL IJ SOAJ injection Inject into the muscle once.     famotidine  (PEPCID ) 10 MG tablet Take 10 mg by mouth daily as needed.  (Patient not taking: Reported on 08/29/2024)     omeprazole  (PRILOSEC) 40 MG capsule Take 1 capsule (40 mg total) by mouth daily. For six weeks then as needed (Patient not taking: Reported on 08/29/2024) 45 capsule 5   predniSONE  (DELTASONE ) 20 MG tablet Take 20 mg by mouth 2 (two) times daily. (Patient not taking: Reported on 08/29/2024)     promethazine -dextromethorphan (PROMETHAZINE -DM) 6.25-15 MG/5ML syrup Take 5 mLs by mouth 4 (four) times daily as needed for cough. 240 mL 0    Current Facility-Administered Medications  Medication Dose Route Frequency Provider Last Rate Last Admin   0.9 %  sodium chloride  infusion  500 mL Intravenous Once Danis, Victory CROME III, MD          ___________________________________________________________________ Objective   Exam:  BP (!) 158/93   Pulse 62   Temp (!) 96.8 F (36 C) (Temporal)   Resp (!) 7   Ht 5' 11 (1.803 m)   Wt 188 lb (85.3 kg)   SpO2 100%   BMI 26.22 kg/m   CV: regular , S1/S2 Resp: clear to auscultation bilaterally, normal RR and effort noted GI: soft, no tenderness, with active bowel sounds.   Assessment: Encounter Diagnosis  Name Primary?   Gastroesophageal reflux disease, unspecified whether esophagitis present Yes     Plan:  EGD  The benefits and risks of the planned procedure(s) were described in detail with the patient or (when appropriate) their health care proxy.  Risks were outlined as including, but not limited to, bleeding, infection, perforation, adverse medication reaction leading to cardiac or pulmonary decompensation, pancreatitis (if ERCP).  The limitation of incomplete mucosal visualization was also discussed.  No guarantees or warranties were given.  The patient is appropriate for an endoscopic procedure in the ambulatory setting.   - Victory Brand, MD

## 2024-08-29 NOTE — Op Note (Signed)
 [facility] Patient Name: Jeffrey Adkins Procedure Date: 08/29/2024 3:17 PM MRN: [id number] Endoscopist: Victory L. Legrand , MD, [id number] Age: 68 Referring MD:  Date of Birth: 08-Aug-1956 Gender: Male Account #: [id number] Procedure:                Upper GI endoscopy Indications:              Esophageal reflux symptoms that persist despite                            appropriate therapy                           Clinical details in 07/10/2024 office note                           Patient reports that on several weeks of higher PPI                            dose afterward his symptoms significantly improved                            to the point that he felt he could discontinue it.                            He has been off PPI several weeks since then and                            feeling well. Medicines:                Monitored Anesthesia Care Procedure:                Pre-Anesthesia Assessment:                           - Prior to the procedure, a History and Physical                            was performed, and patient medications and                            allergies were reviewed. The patient's tolerance of                            previous anesthesia was also reviewed. The risks                            and benefits of the procedure and the sedation                            options and risks were discussed with the patient.                            All questions were answered, and informed consent  was obtained. Prior Anticoagulants: The patient has                            taken no anticoagulant or antiplatelet agents. ASA                            Grade Assessment: III - A patient with severe                            systemic disease. After reviewing the risks and                            benefits, the patient was deemed in satisfactory                            condition to undergo the procedure.                            After obtaining informed consent, the endoscope was                            passed under direct vision. Throughout the                            procedure, the patient's blood pressure, pulse, and                            oxygen saturations were monitored continuously. The                            GIF [id number] #[id number] was introduced through the                            mouth, and advanced to the second part of duodenum.                            The upper GI endoscopy was accomplished without                            difficulty. The patient tolerated the procedure                            well. Scope In: Scope Out: Findings:                 The esophagus was normal.                           The stomach was normal.                           The cardia and gastric fundus were normal on                            retroflexion.  The examined duodenum was normal. Complications:            No immediate complications. Estimated Blood Loss:     Estimated blood loss: none. Impression:               - Normal esophagus.                           - Normal stomach.                           - Normal examined duodenum.                           - No specimens collected.                           Nonerosive reflux Recommendation:           - Patient has a contact number available for                            emergencies. The signs and symptoms of potential                            delayed complications were discussed with the                            patient. Return to normal activities tomorrow.                            Written discharge instructions were provided to the                            patient.                           - Resume previous diet.                           - Continue present medications. PPI can be resumed                            or courses of therapy when/if frequent heartburn                            recurs. Return to  our office if needed. [name] L. Legrand, MD 08/29/2024 3:57:49 PM This report has been signed electronically.

## 2024-08-30 ENCOUNTER — Telehealth: Payer: Self-pay

## 2024-08-30 NOTE — Telephone Encounter (Signed)
  Follow up Call-     08/29/2024    2:49 PM  Call back number  Post procedure Call Back phone  # [phone]  Permission to leave phone message Yes    Attempted to call regarding follow-up. No answer, VM left.

## 2024-09-09 DIAGNOSIS — G5622 Lesion of ulnar nerve, left upper limb: Secondary | ICD-10-CM | POA: Diagnosis not present

## 2024-09-20 DIAGNOSIS — S46319A Strain of muscle, fascia and tendon of triceps, unspecified arm, initial encounter: Secondary | ICD-10-CM | POA: Diagnosis not present

## 2024-09-27 DIAGNOSIS — S46319A Strain of muscle, fascia and tendon of triceps, unspecified arm, initial encounter: Secondary | ICD-10-CM | POA: Diagnosis not present

## 2024-10-03 DIAGNOSIS — S46319A Strain of muscle, fascia and tendon of triceps, unspecified arm, initial encounter: Secondary | ICD-10-CM | POA: Diagnosis not present

## 2024-11-29 ENCOUNTER — Telehealth: Payer: Self-pay | Admitting: Nurse Practitioner

## 2024-11-29 NOTE — Telephone Encounter (Signed)
 Copied from CRM #[id number]. Topic: Appointments - Scheduling Inquiry for Clinic >> Nov 29, 2024 10:53 AM Corean SAUNDERS wrote: Reason for CRM: Patient states he missed a call from [facility] and is requesting a call back to schedule his CY in January.

## 2024-12-06 NOTE — Telephone Encounter (Signed)
 CT has already been scheduled.

## 2025-01-08 NOTE — Progress Notes (Signed)
 Jeffrey Adkins                                          MRN: [id number]   01/08/2025   The [facility] Quality Team Specialist reviewed this patient medical record for the purposes of chart review for care gap closure. The following were reviewed: chart review for care gap closure-colorectal cancer screening.    [facility] Quality Team

## 2025-01-13 ENCOUNTER — Ambulatory Visit (HOSPITAL_BASED_OUTPATIENT_CLINIC_OR_DEPARTMENT_OTHER)

## 2025-01-16 ENCOUNTER — Ambulatory Visit (HOSPITAL_BASED_OUTPATIENT_CLINIC_OR_DEPARTMENT_OTHER)
Admission: RE | Admit: 2025-01-16 | Discharge: 2025-01-16 | Disposition: A | Discharge status: Home / Self Care | Source: Ambulatory Visit | Attending: Pulmonary Disease | Admitting: Pulmonary Disease

## 2025-01-16 DIAGNOSIS — R911 Solitary pulmonary nodule: Secondary | ICD-10-CM | POA: Diagnosis present

## 2025-01-16 DIAGNOSIS — D869 Sarcoidosis, unspecified: Secondary | ICD-10-CM | POA: Diagnosis present

## 2025-01-19 ENCOUNTER — Ambulatory Visit: Payer: Self-pay | Admitting: Pulmonary Disease

## 2025-01-27 ENCOUNTER — Ambulatory Visit (HOSPITAL_BASED_OUTPATIENT_CLINIC_OR_DEPARTMENT_OTHER): Admitting: Pulmonary Disease

## 2025-02-06 ENCOUNTER — Ambulatory Visit (HOSPITAL_BASED_OUTPATIENT_CLINIC_OR_DEPARTMENT_OTHER): Admitting: Pulmonary Disease
